# Patient Record
Sex: Male | Born: 1960 | Race: Black or African American | Hispanic: No | Smoking: Former smoker
Health system: Southern US, Community
[De-identification: ages and names within clinical notes are randomized; demographics above are authoritative.]

## PROBLEM LIST (undated history)

## (undated) DIAGNOSIS — J309 Allergic rhinitis, unspecified: Secondary | ICD-10-CM

## (undated) DIAGNOSIS — J45909 Unspecified asthma, uncomplicated: Secondary | ICD-10-CM

## (undated) DIAGNOSIS — J189 Pneumonia, unspecified organism: Secondary | ICD-10-CM

## (undated) DIAGNOSIS — I10 Essential (primary) hypertension: Secondary | ICD-10-CM

## (undated) DIAGNOSIS — I5030 Unspecified diastolic (congestive) heart failure: Secondary | ICD-10-CM

## (undated) HISTORY — PX: OTHER SURGICAL HISTORY: SHX169

---

## 2001-07-24 ENCOUNTER — Emergency Department (HOSPITAL_COMMUNITY): Admission: EM | Admit: 2001-07-24 | Discharge: 2001-07-24 | Payer: Self-pay | Admitting: *Deleted

## 2001-07-24 ENCOUNTER — Encounter: Payer: Self-pay | Admitting: *Deleted

## 2001-08-02 ENCOUNTER — Emergency Department (HOSPITAL_COMMUNITY): Admission: EM | Admit: 2001-08-02 | Discharge: 2001-08-02 | Payer: Self-pay | Admitting: Emergency Medicine

## 2001-11-10 ENCOUNTER — Emergency Department (HOSPITAL_COMMUNITY): Admission: EM | Admit: 2001-11-10 | Discharge: 2001-11-10 | Payer: Self-pay | Admitting: Emergency Medicine

## 2001-12-11 ENCOUNTER — Emergency Department (HOSPITAL_COMMUNITY): Admission: EM | Admit: 2001-12-11 | Discharge: 2001-12-11 | Payer: Self-pay | Admitting: Emergency Medicine

## 2001-12-11 ENCOUNTER — Encounter: Payer: Self-pay | Admitting: Emergency Medicine

## 2002-01-14 ENCOUNTER — Emergency Department (HOSPITAL_COMMUNITY): Admission: EM | Admit: 2002-01-14 | Discharge: 2002-01-14 | Payer: Self-pay

## 2016-12-20 ENCOUNTER — Emergency Department (HOSPITAL_COMMUNITY): Payer: Self-pay

## 2016-12-20 ENCOUNTER — Inpatient Hospital Stay (HOSPITAL_COMMUNITY): Payer: Self-pay

## 2016-12-20 ENCOUNTER — Encounter (HOSPITAL_COMMUNITY): Payer: Self-pay | Admitting: *Deleted

## 2016-12-20 ENCOUNTER — Inpatient Hospital Stay (HOSPITAL_COMMUNITY)
Admission: EM | Admit: 2016-12-20 | Discharge: 2016-12-24 | DRG: 291 | Disposition: A | Discharge status: Home / Self Care | Payer: Self-pay | Attending: Internal Medicine | Admitting: Internal Medicine

## 2016-12-20 DIAGNOSIS — Z833 Family history of diabetes mellitus: Secondary | ICD-10-CM

## 2016-12-20 DIAGNOSIS — R0602 Shortness of breath: Secondary | ICD-10-CM

## 2016-12-20 DIAGNOSIS — J849 Interstitial pulmonary disease, unspecified: Secondary | ICD-10-CM | POA: Diagnosis present

## 2016-12-20 DIAGNOSIS — J9600 Acute respiratory failure, unspecified whether with hypoxia or hypercapnia: Secondary | ICD-10-CM | POA: Diagnosis present

## 2016-12-20 DIAGNOSIS — E669 Obesity, unspecified: Secondary | ICD-10-CM | POA: Diagnosis present

## 2016-12-20 DIAGNOSIS — I5031 Acute diastolic (congestive) heart failure: Secondary | ICD-10-CM | POA: Diagnosis present

## 2016-12-20 DIAGNOSIS — I110 Hypertensive heart disease with heart failure: Principal | ICD-10-CM | POA: Diagnosis present

## 2016-12-20 DIAGNOSIS — J441 Chronic obstructive pulmonary disease with (acute) exacerbation: Secondary | ICD-10-CM | POA: Diagnosis present

## 2016-12-20 DIAGNOSIS — J45901 Unspecified asthma with (acute) exacerbation: Secondary | ICD-10-CM

## 2016-12-20 DIAGNOSIS — Z8249 Family history of ischemic heart disease and other diseases of the circulatory system: Secondary | ICD-10-CM

## 2016-12-20 DIAGNOSIS — R778 Other specified abnormalities of plasma proteins: Secondary | ICD-10-CM

## 2016-12-20 DIAGNOSIS — Z8349 Family history of other endocrine, nutritional and metabolic diseases: Secondary | ICD-10-CM

## 2016-12-20 DIAGNOSIS — R0989 Other specified symptoms and signs involving the circulatory and respiratory systems: Secondary | ICD-10-CM | POA: Diagnosis present

## 2016-12-20 DIAGNOSIS — Z79899 Other long term (current) drug therapy: Secondary | ICD-10-CM

## 2016-12-20 DIAGNOSIS — I509 Heart failure, unspecified: Secondary | ICD-10-CM

## 2016-12-20 DIAGNOSIS — Z87891 Personal history of nicotine dependence: Secondary | ICD-10-CM

## 2016-12-20 DIAGNOSIS — Z888 Allergy status to other drugs, medicaments and biological substances status: Secondary | ICD-10-CM

## 2016-12-20 DIAGNOSIS — B370 Candidal stomatitis: Secondary | ICD-10-CM | POA: Diagnosis present

## 2016-12-20 DIAGNOSIS — I248 Other forms of acute ischemic heart disease: Secondary | ICD-10-CM | POA: Diagnosis present

## 2016-12-20 DIAGNOSIS — I2489 Other forms of acute ischemic heart disease: Secondary | ICD-10-CM

## 2016-12-20 DIAGNOSIS — J449 Chronic obstructive pulmonary disease, unspecified: Secondary | ICD-10-CM

## 2016-12-20 DIAGNOSIS — I10 Essential (primary) hypertension: Secondary | ICD-10-CM

## 2016-12-20 DIAGNOSIS — R7989 Other specified abnormal findings of blood chemistry: Secondary | ICD-10-CM | POA: Diagnosis present

## 2016-12-20 DIAGNOSIS — R748 Abnormal levels of other serum enzymes: Secondary | ICD-10-CM

## 2016-12-20 DIAGNOSIS — J9621 Acute and chronic respiratory failure with hypoxia: Secondary | ICD-10-CM | POA: Diagnosis present

## 2016-12-20 DIAGNOSIS — Z801 Family history of malignant neoplasm of trachea, bronchus and lung: Secondary | ICD-10-CM

## 2016-12-20 DIAGNOSIS — R0609 Other forms of dyspnea: Secondary | ICD-10-CM

## 2016-12-20 DIAGNOSIS — J4551 Severe persistent asthma with (acute) exacerbation: Secondary | ICD-10-CM

## 2016-12-20 DIAGNOSIS — Z800 Family history of malignant neoplasm of digestive organs: Secondary | ICD-10-CM

## 2016-12-20 DIAGNOSIS — Z8701 Personal history of pneumonia (recurrent): Secondary | ICD-10-CM

## 2016-12-20 DIAGNOSIS — I5030 Unspecified diastolic (congestive) heart failure: Secondary | ICD-10-CM

## 2016-12-20 DIAGNOSIS — Z825 Family history of asthma and other chronic lower respiratory diseases: Secondary | ICD-10-CM

## 2016-12-20 DIAGNOSIS — Z9981 Dependence on supplemental oxygen: Secondary | ICD-10-CM

## 2016-12-20 DIAGNOSIS — Z6831 Body mass index (BMI) 31.0-31.9, adult: Secondary | ICD-10-CM

## 2016-12-20 HISTORY — DX: Allergic rhinitis, unspecified: J30.9

## 2016-12-20 HISTORY — DX: Unspecified asthma, uncomplicated: J45.909

## 2016-12-20 HISTORY — DX: Essential (primary) hypertension: I10

## 2016-12-20 HISTORY — DX: Unspecified diastolic (congestive) heart failure: I50.30

## 2016-12-20 HISTORY — DX: Pneumonia, unspecified organism: J18.9

## 2016-12-20 LAB — BASIC METABOLIC PANEL
ANION GAP: 7 (ref 5–15)
BUN: 18 mg/dL (ref 6–20)
CHLORIDE: 95 mmol/L — AB (ref 101–111)
CO2: 32 mmol/L (ref 22–32)
Calcium: 8.5 mg/dL — ABNORMAL LOW (ref 8.9–10.3)
Creatinine, Ser: 1.11 mg/dL (ref 0.61–1.24)
GFR calc Af Amer: >60 mL/min (ref >60)
GLUCOSE: 122 mg/dL — AB (ref 65–99)
POTASSIUM: 3.7 mmol/L (ref 3.5–5.1)
Sodium: 134 mmol/L — ABNORMAL LOW (ref 135–145)

## 2016-12-20 LAB — CBC WITH DIFFERENTIAL/PLATELET
BASOS ABS: 0 10*3/uL (ref 0.0–0.1)
Basophils Relative: 0 %
EOS PCT: 1 %
Eosinophils Absolute: 0.1 10*3/uL (ref 0.0–0.7)
HEMATOCRIT: 52.1 % — AB (ref 39.0–52.0)
HEMOGLOBIN: 16.8 g/dL (ref 13.0–17.0)
LYMPHS ABS: 1.7 10*3/uL (ref 0.7–4.0)
LYMPHS PCT: 12 %
MCH: 31.7 pg (ref 26.0–34.0)
MCHC: 32.2 g/dL (ref 30.0–36.0)
MCV: 98.3 fL (ref 78.0–100.0)
Monocytes Absolute: 2 10*3/uL — ABNORMAL HIGH (ref 0.1–1.0)
Monocytes Relative: 14 %
NEUTROS ABS: 10.6 10*3/uL — AB (ref 1.7–7.7)
Neutrophils Relative %: 73 %
PLATELETS: 194 10*3/uL (ref 150–400)
RBC: 5.3 MIL/uL (ref 4.22–5.81)
RDW: 13.6 % (ref 11.5–15.5)
WBC: 14.4 10*3/uL — AB (ref 4.0–10.5)

## 2016-12-20 LAB — BRAIN NATRIURETIC PEPTIDE: B Natriuretic Peptide: 361 pg/mL — ABNORMAL HIGH (ref 0.0–100.0)

## 2016-12-20 LAB — TROPONIN I
Troponin I: 0.17 ng/mL (ref <0.03)
Troponin I: 0.12 ng/mL (ref <0.03)
Troponin I: 0.12 ng/mL (ref <0.03)
Troponin I: 0.11 ng/mL (ref <0.03)

## 2016-12-20 MED ORDER — IPRATROPIUM-ALBUTEROL 0.5-2.5 (3) MG/3ML IN SOLN
3.0000 mL | Freq: Once | RESPIRATORY_TRACT | Status: AC
  Administered 2016-12-20: 3 mL via RESPIRATORY_TRACT
  Filled 2016-12-20: qty 3

## 2016-12-20 MED ORDER — HEPARIN SODIUM (PORCINE) 5000 UNIT/ML IJ SOLN
5000.0000 [IU] | Freq: Three times a day (TID) | INTRAMUSCULAR | Status: DC
  Administered 2016-12-22 – 2016-12-23 (×2): 5000 [IU] via SUBCUTANEOUS
  Filled 2016-12-20 (×7): qty 1

## 2016-12-20 MED ORDER — IPRATROPIUM-ALBUTEROL 0.5-2.5 (3) MG/3ML IN SOLN
3.0000 mL | Freq: Three times a day (TID) | RESPIRATORY_TRACT | Status: DC
  Administered 2016-12-21 (×3): 3 mL via RESPIRATORY_TRACT
  Filled 2016-12-20 (×3): qty 3

## 2016-12-20 MED ORDER — ALBUTEROL SULFATE (2.5 MG/3ML) 0.083% IN NEBU
5.0000 mg | INHALATION_SOLUTION | Freq: Once | RESPIRATORY_TRACT | Status: AC
  Administered 2016-12-20: 5 mg via RESPIRATORY_TRACT
  Filled 2016-12-20: qty 6

## 2016-12-20 MED ORDER — ALBUTEROL SULFATE (2.5 MG/3ML) 0.083% IN NEBU: 5.0000 mg | INHALATION_SOLUTION | Freq: Once | RESPIRATORY_TRACT | Status: DC

## 2016-12-20 MED ORDER — IPRATROPIUM BROMIDE 0.02 % IN SOLN
0.5000 mg | Freq: Once | RESPIRATORY_TRACT | Status: AC
  Administered 2016-12-20: 0.5 mg via RESPIRATORY_TRACT
  Filled 2016-12-20: qty 2.5

## 2016-12-20 MED ORDER — ONDANSETRON HCL 4 MG/2ML IJ SOLN
4.0000 mg | Freq: Four times a day (QID) | INTRAMUSCULAR | Status: DC | PRN
Start: 2016-12-20 — End: 2016-12-24

## 2016-12-20 MED ORDER — NITROGLYCERIN 0.4 MG SL SUBL: 0.4000 mg | SUBLINGUAL_TABLET | SUBLINGUAL | Status: DC | PRN

## 2016-12-20 MED ORDER — IPRATROPIUM-ALBUTEROL 0.5-2.5 (3) MG/3ML IN SOLN
3.0000 mL | RESPIRATORY_TRACT | Status: DC | PRN
  Administered 2016-12-21 (×3): 3 mL via RESPIRATORY_TRACT
  Filled 2016-12-20 (×3): qty 3

## 2016-12-20 MED ORDER — ACETAMINOPHEN 325 MG PO TABS: 650.0000 mg | ORAL_TABLET | ORAL | Status: DC | PRN

## 2016-12-20 MED ORDER — ASPIRIN EC 325 MG PO TBEC
325.0000 mg | DELAYED_RELEASE_TABLET | Freq: Every day | ORAL | Status: DC
  Administered 2016-12-20 – 2016-12-24 (×5): 325 mg via ORAL
  Filled 2016-12-20 (×5): qty 1

## 2016-12-20 MED ORDER — IPRATROPIUM-ALBUTEROL 0.5-2.5 (3) MG/3ML IN SOLN
RESPIRATORY_TRACT | Status: AC
  Administered 2016-12-20: 3 mL
  Filled 2016-12-20: qty 3

## 2016-12-20 MED ORDER — METHYLPREDNISOLONE SODIUM SUCC 125 MG IJ SOLR
125.0000 mg | Freq: Two times a day (BID) | INTRAMUSCULAR | Status: DC
  Administered 2016-12-20 – 2016-12-21 (×3): 125 mg via INTRAVENOUS
  Filled 2016-12-20 (×3): qty 2

## 2016-12-20 MED ORDER — METHYLPREDNISOLONE SODIUM SUCC 125 MG IJ SOLR
125.0000 mg | Freq: Once | INTRAMUSCULAR | Status: AC
  Administered 2016-12-20: 125 mg via INTRAVENOUS
  Filled 2016-12-20 (×2): qty 2

## 2016-12-20 MED ORDER — IPRATROPIUM-ALBUTEROL 0.5-2.5 (3) MG/3ML IN SOLN
3.0000 mL | Freq: Once | RESPIRATORY_TRACT | Status: AC
Start: 2016-12-20 — End: 2016-12-20
  Administered 2016-12-20: 3 mL via RESPIRATORY_TRACT
  Filled 2016-12-20: qty 3

## 2016-12-20 NOTE — ED Notes (Signed)
Meal given to pt per Dr. Adriana Simas.

## 2016-12-20 NOTE — H&P (Signed)
History and Physical    Leslie Green ZOX:[id number] DOB: 21-Aug-1960 DOA: 12/20/2016  Referring MD/NP/PA: Adriana Simas  PCP: System, Provider Not In  Outpatient Specialists: none   Patient coming from: Home   Chief Complaint: Acute resp failure w/ hypoxia, CHF, COPD, demand ischemia   HPI: Leslie Green is a 56 y.o. male with medical history significant of asthma/COPD, HTN, obesity presenting w/ acute resp failure w/ hypoxia, CHF, COPD, demand ischemia. Patient reports progressive shortness of breath over the past 2 or 3 months. States that he was formally diagnosed with asthma/COPD in the outpatient setting. Has been using inhalers with minimal improvement in symptoms. Does report having dyspnea on exertion. Intermittent orthopnea as well as lower Chumney swelling. Was placed on hydrochlorothiazide in the outpatient setting for hypertension. Has help with fluid but has not helped with shortness of breath. No chest pain. Does admit to eating high salt diet. Denies any heavy NSAID use. No prior history of cardiac disease. Prior 1 pack per day smoking history up until approximately 2-3 weeks ago.  ED Course: Presented to ER afebrile, hemodynamically stable. WBC 14. Trop 0.17, BNP 316. Chest x-ray with cardiomegaly and mild pulmonary vascular congestion also underlying chronic interstitial lung disease.  Review of Systems: As per HPI otherwise 10 point review of systems negative.    Past Medical History:  Diagnosis Date  . Allergic rhinitis   . Asthma   . Essential hypertension   . Recurrent pneumonia     Past Surgical History:  Procedure Laterality Date  . None       reports that he has quit smoking. He has never used smokeless tobacco. He reports that he drinks alcohol. He reports that he does not use drugs.  Allergies  Allergen Reactions  . Theophyllines Other (See Comments)    Headache     Family History  Problem Relation Age of Onset  . Hypertension Mother   . Asthma Mother   . Colon  cancer Father   . Lung cancer Father   . Diabetes Mellitus II Father   . Hypertension Father   . Hyperlipidemia Father   . CAD Maternal Grandmother   . CAD Maternal Uncle      Prior to Admission medications   Medication Sig Start Date End Date Taking? Authorizing Provider  albuterol (PROVENTIL HFA;VENTOLIN HFA) 108 (90 Base) MCG/ACT inhaler Inhale 1 puff into the lungs every 4 (four) hours as needed.   Yes [provider]  albuterol (PROVENTIL) (2.5 MG/3ML) 0.083% nebulizer solution Inhale 2.5 mg into the lungs 3 (three) times daily.   Yes [provider]  fluticasone furoate-vilanterol (BREO ELLIPTA) 200-25 MCG/INH AEPB Inhale 1 puff into the lungs daily.   Yes [provider]  hydrochlorothiazide (HYDRODIURIL) 25 MG tablet Take 1 tablet by mouth daily.   Yes [provider]  ipratropium (ATROVENT) 0.02 % nebulizer solution Inhale 0.5 mg into the lungs 4 (four) times daily.   Yes [provider]  lovastatin (MEVACOR) 20 MG tablet Take 1 tablet by mouth every evening.   Yes [provider]  mometasone (NASONEX) 50 MCG/ACT nasal spray Place 2 sprays into the nose daily.   Yes [provider]  umeclidinium bromide (INCRUSE ELLIPTA) 62.5 MCG/INH AEPB Inhale 1 puff into the lungs daily.   Yes [provider]    Physical Exam: Vitals:   12/20/16 1500 12/20/16 1505 12/20/16 1530 12/20/16 1545  BP: (!) 128/55  (!) 123/57   Pulse:  95  93  Resp:  20 17   Temp:      TempSrc:      SpO2:  94%  94%  Weight:      Height:          Constitutional: NAD, calm, minimal to mild distress, morbidly obese  Vitals:   12/20/16 1500 12/20/16 1505 12/20/16 1530 12/20/16 1545  BP: (!) 128/55  (!) 123/57   Pulse:  95  93  Resp:  20 17   Temp:      TempSrc:      SpO2:  94%  94%  Weight:      Height:       Eyes: PERRL, lids and conjunctivae normal ENMT: Mucous membranes are moist. Posterior pharynx clear of any exudate or  lesions.Normal dentition.  Neck: normal, supple, no masses, no thyromegaly Respiratory: clear to auscultation bilaterally, + diffuse wheezing. Normal respiratory effort. No accessory muscle use.  Cardiovascular: Regular rate and rhythm, no murmurs / rubs / gallops. No extremity edema. 2+ pedal pulses. No carotid bruits.  Abdomen: obese abdomen, no tenderness, no masses palpated. No hepatosplenomegaly. Bowel sounds positive.  Musculoskeletal: no clubbing / cyanosis. No joint deformity upper and lower extremities. Good ROM, no contractures. Normal muscle tone.  Skin: no rashes, lesions, ulcers. No induration Neurologic: CN 2-12 grossly intact. Sensation intact, DTR normal. Strength 5/5 in all 4.  Psychiatric: Normal judgment and insight. Alert and oriented x 3. Normal mood.    Labs on Admission: I have personally reviewed following labs and imaging studies  CBC:  Recent Labs Lab 12/20/16 0945  WBC 14.4*  NEUTROABS 10.6*  HGB 16.8  HCT 52.1*  MCV 98.3  PLT 194   Basic Metabolic Panel:  Recent Labs Lab 12/20/16 0945  NA 134*  K 3.7  CL 95*  CO2 32  GLUCOSE 122*  BUN 18  CREATININE 1.11  CALCIUM 8.5*   GFR: Estimated Creatinine Clearance: 91.6 mL/min (by C-G formula based on SCr of 1.11 mg/dL). Liver Function Tests: No results for input(s): AST, ALT, ALKPHOS, BILITOT, PROT, ALBUMIN in the last 168 hours. No results for input(s): LIPASE, AMYLASE in the last 168 hours. No results for input(s): AMMONIA in the last 168 hours. Coagulation Profile: No results for input(s): INR, PROTIME in the last 168 hours. Cardiac Enzymes:  Recent Labs Lab 12/20/16 0945  TROPONINI 0.17*   BNP (last 3 results) No results for input(s): PROBNP in the last 8760 hours. HbA1C: No results for input(s): HGBA1C in the last 72 hours. CBG: No results for input(s): GLUCAP in the last 168 hours. Lipid Profile: No results for input(s): CHOL, HDL, LDLCALC, TRIG, CHOLHDL, LDLDIRECT in the last 72  hours. Thyroid Function Tests: No results for input(s): TSH, T4TOTAL, FREET4, T3FREE, THYROIDAB in the last 72 hours. Anemia Panel: No results for input(s): VITAMINB12, FOLATE, FERRITIN, TIBC, IRON, RETICCTPCT in the last 72 hours. Urine analysis: No results found for: COLORURINE, APPEARANCEUR, LABSPEC, PHURINE, GLUCOSEU, HGBUR, BILIRUBINUR, KETONESUR, PROTEINUR, UROBILINOGEN, NITRITE, LEUKOCYTESUR Sepsis Labs: @LABRCNTIP (procalcitonin:4,lacticidven:4) )No results found for this or any previous visit (from the past 240 hour(s)).   Radiological Exams on Admission: Dg Chest 2 View  Result Date: 12/20/2016 CLINICAL DATA:  Cough and congestion. EXAM: CHEST  2 VIEW COMPARISON:  Chest x-ray 12/11/2001 . FINDINGS: Mediastinum hilar structures are normal. Cardiomegaly with mild pulmonary vascular prominence. Diffuse bilateral pulmonary interstitial prominence noted. These changes may be related to interstitial edema and/or pneumonitis. Underlying chronic interstitial lung disease may be present. Chronic interstitial lung disease was described on  prior chest x-ray report of 12/11/2001. No pneumothorax. IMPRESSION: 1. Cardiomegaly with mild pulmonary venous congestion. 2. Diffuse bilateral pulmonary interstitial prominence noted. These changes may be related to interstitial edema and/or pneumonitis. Underlying chronic interstitial lung disease may be present. Chronic interstitial lung disease described on prior chest x-ray report of 12/11/2001. Follow-up chest x-ray can be obtained for continued evaluation . Electronically Signed   By: Maisie Fus  Register   On: 12/20/2016 10:46    EKG: Independently reviewed. NSR, ? LVH  Assessment/Plan Active Problems:   Acute respiratory failure (HCC)   CHF (congestive heart failure) (HCC)   COPD (chronic obstructive pulmonary disease) (HCC)   Demand ischemia (HCC)     1-Acute resp failure w/ hypoxia -Suspect likely multifactorial from CHF and COPD -Cardiology  consulted-appreciate input -IV diuretics and 2-D echo -IV Solu-Medrol and duo nebs -CT chest pending to better assess lung pathology   2-Elevated trop  -trop 0.17 on admission  -suspect demand ischemia in setting of above  -cards consulted-appreciate input  -start ASA  -trend trop  -IV diuresis  -2D ECHO   3-HTN -titrate BP regimen    DVT prophylaxis: sub q heparin   Code Status: Full Code   Family Communication: No family at bedside   Disposition Plan: Pending further evaluation  Consults called: Cards   Admission status: Inpt    Floydene Flock MD [facility] Pager [phone]  If 7PM-7AM, please contact night-coverage [email or web address] Password [facility]  12/20/2016, 4:31 PM

## 2016-12-20 NOTE — ED Notes (Addendum)
Pt taken to CT.

## 2016-12-20 NOTE — ED Notes (Signed)
Pt requesting neb tx, RT notified.

## 2016-12-20 NOTE — Consult Note (Signed)
Cardiology Consultation:   Patient ID: Leslie Green; [id number]; 1960-06-28   Admit date: 12/20/2016 Date of Consult: 12/20/2016   Primary Care Provider: Kandis Fantasia FNP, [place], [place]  Consulting Cardiologist: Jonelle Sidle, M.D., F.A.C.C.  Patient Profile:   Leslie Green is a 55 y.o. male with a history of asthma, allergic rhinitis, and recurring pneumonia, being evaluated at the request of Dr. Adriana Simas secondary to abnormal troponin I level.  History of Present Illness:   Leslie Green states he woke up and "couldn't breathe" this morning. Was wheezing and having another possible asthma attack. He came to the emergency room for treatment and during workup was found to have a mildly elevated troponin I of 0.17 in the absence of chest pain. He has no history of CAD and says he only has tightness with an asthma attack. He has chronic dyspnea on exertion, states that it has been worse since his last episode of pneumonia. Recently had trouble with some leg swelling but improved with diet. Currently denies chest pain and feels much better after breathing treatment and steroids. He used to be active and walk daily but stopped after pneumonia in January. He has history of hypertension treated with HCTZ and HLD. He has smoked for over 20 pack years but says he just quit when he was told he had COPD. Distant family history of CAD with some uncles and a grandmother with an MI.  Patient went to [facility] last week with an asthma attack and was given steroids and inhalers.  Past Medical History:  Diagnosis Date  . Allergic rhinitis   . Asthma   . Essential hypertension   . Recurrent pneumonia     Past Surgical History:  Procedure Laterality Date  . None       Home Medications:  Prior to Admission medications   Medication Sig Start Date End Date Taking? Authorizing Provider  albuterol (PROVENTIL HFA;VENTOLIN HFA) 108 (90 Base) MCG/ACT inhaler Inhale 1 puff into the lungs every 4 (four)  hours as needed.   Yes [provider]  albuterol (PROVENTIL) (2.5 MG/3ML) 0.083% nebulizer solution Inhale 2.5 mg into the lungs 3 (three) times daily.   Yes [provider]  fluticasone furoate-vilanterol (BREO ELLIPTA) 200-25 MCG/INH AEPB Inhale 1 puff into the lungs daily.   Yes [provider]  hydrochlorothiazide (HYDRODIURIL) 25 MG tablet Take 1 tablet by mouth daily.   Yes [provider]  ipratropium (ATROVENT) 0.02 % nebulizer solution Inhale 0.5 mg into the lungs 4 (four) times daily.   Yes [provider]  lovastatin (MEVACOR) 20 MG tablet Take 1 tablet by mouth every evening.   Yes [provider]  mometasone (NASONEX) 50 MCG/ACT nasal spray Place 2 sprays into the nose daily.   Yes [provider]  umeclidinium bromide (INCRUSE ELLIPTA) 62.5 MCG/INH AEPB Inhale 1 puff into the lungs daily.   Yes [provider]    Allergies:    Allergies  Allergen Reactions  . Theophyllines Other (See Comments)    Headache     Social History:   Social History   Social History  . Marital status: Divorced    Spouse name: N/A  . Number of children: N/A  . Years of education: N/A   Occupational History  . Not on file.   Social History Main Topics  . Smoking status: Former Games developer  . Smokeless tobacco: Never Used  . Alcohol use Yes     Comment: occasionally   .  Drug use: No  . Sexual activity: Not on file   Other Topics Concern  . Not on file   Social History Narrative  . No narrative on file    Family History:    Family History  Problem Relation Age of Onset  . Hypertension Mother   . Asthma Mother   . Colon cancer Father   . Lung cancer Father   . Diabetes Mellitus II Father   . Hypertension Father   . Hyperlipidemia Father   . CAD Maternal Grandmother   . CAD Maternal Uncle      ROS:  Please see the history of present illness.  Review of Systems  Constitution: Positive for weight gain.    HENT: Negative.   Cardiovascular: Negative.   Respiratory: Negative.   Endocrine: Negative.   Hematologic/Lymphatic: Negative.   Musculoskeletal: Negative.   Gastrointestinal: Negative.   Genitourinary: Negative.   Neurological: Negative.      All other ROS reviewed and negative.     Physical Exam/Data:   Vitals:   12/20/16 1500 12/20/16 1505 12/20/16 1530 12/20/16 1545  BP: (!) 128/55  (!) 123/57   Pulse:  95  93  Resp:  20 17   Temp:      TempSrc:      SpO2:  94%  94%  Weight:      Height:       No intake or output data in the 24 hours ending 12/20/16 1604 Filed Weights   12/20/16 0928  Weight: 220 lb (99.8 kg)   Body mass index is 30.68 kg/m.   General:  Overweight male, no acute distress  HEENT: normal Lymph: no adenopathy Neck: no JVD Endocrine:  No thryomegaly Vascular: No carotid bruits; FA pulses 2+ bilaterally without bruits  Cardiac:  normal S1, S2; RRR; no murmur distant heart sounds with wheezing Lungs:  Diffuse inspiratory and expiratory wheezing throughout lung fields  Abd: soft, nontender, no hepatomegaly  Ext: no edema Musculoskeletal:  No deformities, BUE and BLE strength normal and equal Skin: warm and dry  Neuro:  CNs 2-12 intact, no focal abnormalities noted Psych:  Normal affect   EKG:  The EKG was personally reviewed and demonstrates: Normal sinus rhythm with RVH no old EKG to compare Telemetry:  Telemetry was personally reviewed and demonstrates:  Normal sinus rhythm at 90 bpm  Laboratory Data:  Chemistry  Recent Labs Lab 12/20/16 0945  NA 134*  K 3.7  CL 95*  CO2 32  GLUCOSE 122*  BUN 18  CREATININE 1.11  CALCIUM 8.5*  GFRNONAA >60  GFRAA >60  ANIONGAP 7    No results for input(s): PROT, ALBUMIN, AST, ALT, ALKPHOS, BILITOT in the last 168 hours. Hematology  Recent Labs Lab 12/20/16 0945  WBC 14.4*  RBC 5.30  HGB 16.8  HCT 52.1*  MCV 98.3  MCH 31.7  MCHC 32.2  RDW 13.6  PLT 194   Cardiac Enzymes  Recent  Labs Lab 12/20/16 0945  TROPONINI 0.17*   No results for input(s): TROPIPOC in the last 168 hours.  BNP  Recent Labs Lab 12/20/16 0945  BNP 361.0*    DDimer No results for input(s): DDIMER in the last 168 hours.  Radiology/Studies:  Dg Chest 2 View  Result Date: 12/20/2016 CLINICAL DATA:  Cough and congestion. EXAM: CHEST  2 VIEW COMPARISON:  Chest x-ray 12/11/2001 . FINDINGS: Mediastinum hilar structures are normal. Cardiomegaly with mild pulmonary vascular prominence. Diffuse bilateral pulmonary interstitial prominence noted. These changes may be  related to interstitial edema and/or pneumonitis. Underlying chronic interstitial lung disease may be present. Chronic interstitial lung disease was described on prior chest x-ray report of 12/11/2001. No pneumothorax. IMPRESSION: 1. Cardiomegaly with mild pulmonary venous congestion. 2. Diffuse bilateral pulmonary interstitial prominence noted. These changes may be related to interstitial edema and/or pneumonitis. Underlying chronic interstitial lung disease may be present. Chronic interstitial lung disease described on prior chest x-ray report of 12/11/2001. Follow-up chest x-ray can be obtained for continued evaluation . Electronically Signed   By: Maisie Fus  Register   On: 12/20/2016 10:46    Assessment and Plan:   1. Elevated troponin question secondary to demand ischemia with respiratory distress acute asthma attack. EKG without acute change .Does has risk factors for CAD. We'll cycle troponins to rule out MI.  2-D echo. Patient is asking for limited testing because he has no insurance. 2. Hypertension controlled with HCTZ 3. HLD 4. ? mild CHF with BNP of 361 and mild pulmonary venous congestion on chest x-ray. Exam unremarkable. Await echo results.   Elson Clan, PA-C 12/20/2016 4:04 PM    Attending note:  Patient seen and examined. Discussed the case with Ms. Sharmon Leyden. Leslie Green presents with shortness of breath and  wheezing, intermittent cough, reports recurring symptoms over the last several months but worse since an episode of pneumonia earlier this year. He has history of tobacco abuse, has been diagnosed with asthma and COPD based on outside records, had pulmonary consultation through the [facility] clinic back in May. He does not report any chest pain with exertion but reports NYHA class III dyspnea on exertion. He has had some intermittent leg edema as well. Additional history includes hypertension.  Patient evaluated in the ER, treated with nebulizer therapy and also steroids, states that he is feeling better. Systolic blood pressure in the 120s with sinus rhythm present on telemetry. Lungs exhibit rhonchorous breath sounds with prolonged expiratory phase and wheezing, decreased breath sounds at the bases. Cardiac exam reveals distant RRR without obvious gallop although PMI is indistinct. Lab work shows creatinine 1.1, potassium 3.7, BNP 361, troponin I 0.17, WBC 14.4, hemoglobin 16.8. ECG shows sinus rhythm with rightward axis. Chest x-ray shows cardiomegaly with interstitial opacities that are diffuse, edema or interstitial lung disease to be considered.  Mildly elevated troponin I in the absence of chest pain but with respiratory distress and reported history of asthma/COPD with worsening dyspnea on exertion, fatigue, and cough as noted above. Could be secondary to myocardial strain/demand ischemia, however need further serial markers to better document trend for ACS. Recommend admission to the hospitalist service for further management. Would obtain an echocardiogram as well as to exclude cardiomyopathy in light of his dyspnea on exertion. Of note, patient has no health insurance, would recommend case manager involvement for potential assistance programs.  Jonelle Sidle, M.D., F.A.C.C.

## 2016-12-20 NOTE — ED Notes (Signed)
Pt states that he may want to leave AMA. Pt states that it was complicated, but he didn't know if he wanted to be admitted and be on a strict diet. Dr. Adriana Simas notified and will go back and speak to the pt for a second time regarding admission.

## 2016-12-20 NOTE — ED Provider Notes (Signed)
[facility]Y DEPT Provider Note   CSN: [id number] Arrival date & time: 12/20/16  4098     History   Chief Complaint Chief Complaint  Patient presents with  . Shortness of Breath    HPI Leslie Green is a 56 y.o. male.  Patient has long-standing asthma since his childhood. He lives in [place].  Coughing and wheezing has persisted for several weeks.   He was diagnosed with pneumonia earlier this year and has put on 40 pounds secondary to persistent prednisone usage.  He is on 3 L of nasal oxygen at night.  No known history of cardiac disease.  No crushing substernal chest pain, diaphoresis, nausea.      Past Medical History:  Diagnosis Date  . Allergic rhinitis   . Asthma   . Essential hypertension   . Recurrent pneumonia     There are no active problems to display for this patient.   Past Surgical History:  Procedure Laterality Date  . None         Home Medications    Prior to Admission medications   Medication Sig Start Date End Date Taking? Authorizing Provider  albuterol (PROVENTIL HFA;VENTOLIN HFA) 108 (90 Base) MCG/ACT inhaler Inhale 1 puff into the lungs every 4 (four) hours as needed.   Yes [provider]  albuterol (PROVENTIL) (2.5 MG/3ML) 0.083% nebulizer solution Inhale 2.5 mg into the lungs 3 (three) times daily.   Yes [provider]  fluticasone furoate-vilanterol (BREO ELLIPTA) 200-25 MCG/INH AEPB Inhale 1 puff into the lungs daily.   Yes [provider]  hydrochlorothiazide (HYDRODIURIL) 25 MG tablet Take 1 tablet by mouth daily.   Yes [provider]  ipratropium (ATROVENT) 0.02 % nebulizer solution Inhale 0.5 mg into the lungs 4 (four) times daily.   Yes [provider]  lovastatin (MEVACOR) 20 MG tablet Take 1 tablet by mouth every evening.   Yes [provider]  mometasone (NASONEX) 50 MCG/ACT nasal spray Place 2 sprays into the nose daily.   Yes [provider]  umeclidinium  bromide (INCRUSE ELLIPTA) 62.5 MCG/INH AEPB Inhale 1 puff into the lungs daily.   Yes [provider]    Family History Family History  Problem Relation Age of Onset  . Hypertension Mother   . Asthma Mother   . Colon cancer Father   . Lung cancer Father   . Diabetes Mellitus II Father   . Hypertension Father   . Hyperlipidemia Father   . CAD Maternal Grandmother   . CAD Maternal Uncle     Social History Social History  Substance Use Topics  . Smoking status: Former Games developer  . Smokeless tobacco: Never Used  . Alcohol use Yes     Comment: occasionally      Allergies   Theophyllines   Review of Systems Review of Systems  All other systems reviewed and are negative.    Physical Exam Updated Vital Signs BP (!) 123/57   Pulse 93   Temp 98.1 F (36.7 C) (Oral)   Resp 17   Ht 5\' 11"  (1.803 m)   Wt 99.8 kg (220 lb)   SpO2 94%   BMI 30.68 kg/m   Physical Exam  Constitutional: He is oriented to person, place, and time. He appears well-developed and well-nourished.  HENT:  Head: Normocephalic and atraumatic.  Eyes: Conjunctivae are normal.  Neck: Neck supple.  Cardiovascular: Normal rate and regular rhythm.   Pulmonary/Chest:  Dyspneic, tachypneic, wheezing  Abdominal: Soft. Bowel sounds are  normal.  Musculoskeletal: Normal range of motion.  Neurological: He is alert and oriented to person, place, and time.  Skin: Skin is warm and dry.  Psychiatric: He has a normal mood and affect. His behavior is normal.  Nursing note and vitals reviewed.    ED Treatments / Results  Labs (all labs ordered are listed, but only abnormal results are displayed) Labs Reviewed  CBC WITH DIFFERENTIAL/PLATELET - Abnormal; Notable for the following:       Result Value   WBC 14.4 (*)    HCT 52.1 (*)    Neutro Abs 10.6 (*)    Monocytes Absolute 2.0 (*)    All other components within normal limits  BASIC METABOLIC PANEL - Abnormal; Notable for the following:    Sodium  134 (*)    Chloride 95 (*)    Glucose, Bld 122 (*)    Calcium 8.5 (*)    All other components within normal limits  TROPONIN I - Abnormal; Notable for the following:    Troponin I 0.17 (*)    All other components within normal limits  BRAIN NATRIURETIC PEPTIDE - Abnormal; Notable for the following:    B Natriuretic Peptide 361.0 (*)    All other components within normal limits    EKG  EKG Interpretation  Date/Time:  Wednesday December 20 2016 09:33:09 EDT Ventricular Rate:  91 PR Interval:    QRS Duration: 100 QT Interval:  378 QTC Calculation: 466 R Axis:   126 Text Interpretation:  Sinus rhythm Consider right ventricular hypertrophy Confirmed by Donnetta Hutching ([id number]) on 12/20/2016 1:28:24 PM       Radiology Dg Chest 2 View  Result Date: 12/20/2016 CLINICAL DATA:  Cough and congestion. EXAM: CHEST  2 VIEW COMPARISON:  Chest x-ray 12/11/2001 . FINDINGS: Mediastinum hilar structures are normal. Cardiomegaly with mild pulmonary vascular prominence. Diffuse bilateral pulmonary interstitial prominence noted. These changes may be related to interstitial edema and/or pneumonitis. Underlying chronic interstitial lung disease may be present. Chronic interstitial lung disease was described on prior chest x-ray report of 12/11/2001. No pneumothorax. IMPRESSION: 1. Cardiomegaly with mild pulmonary venous congestion. 2. Diffuse bilateral pulmonary interstitial prominence noted. These changes may be related to interstitial edema and/or pneumonitis. Underlying chronic interstitial lung disease may be present. Chronic interstitial lung disease described on prior chest x-ray report of 12/11/2001. Follow-up chest x-ray can be obtained for continued evaluation . Electronically Signed   By: Maisie Fus  Register   On: 12/20/2016 10:46    Procedures Procedures (including critical care time)  Medications Ordered in ED Medications  ipratropium-albuterol (DUONEB) 0.5-2.5 (3) MG/3ML nebulizer solution 3 mL (3  mLs Nebulization Given 12/20/16 1018)  methylPREDNISolone sodium succinate (SOLU-MEDROL) 125 mg/2 mL injection 125 mg (125 mg Intravenous Given 12/20/16 1024)  ipratropium-albuterol (DUONEB) 0.5-2.5 (3) MG/3ML nebulizer solution (3 mLs  Given 12/20/16 1306)     Initial Impression / Assessment and Plan / ED Course  I have reviewed the triage vital signs and the nursing notes.  Pertinent labs & imaging results that were available during my care of the patient were reviewed by me and considered in my medical decision making (see chart for details).     Patient with a known history of asthma presents with persistent coughing and wheezing. He responded reasonably well to IV steroids and nebulizer treatments. Troponin was noted to be elevated. Discussed with cardiologist Dr. Diona Browner. He will consult on patient. Admit to general medicine.   CRITICAL CARE Performed by: Donnetta Hutching Total critical care  time: 30 minutes Critical care time was exclusive of separately billable procedures and treating other patients. Critical care was necessary to treat or prevent imminent or life-threatening deterioration. Critical care was time spent personally by me on the following activities: development of treatment plan with patient and/or surrogate as well as nursing, discussions with consultants, evaluation of patient's response to treatment, examination of patient, obtaining history from patient or surrogate, ordering and performing treatments and interventions, ordering and review of laboratory studies, ordering and review of radiographic studies, pulse oximetry and re-evaluation of patient's condition.  Final Clinical Impressions(s) / ED Diagnoses   Final diagnoses:  Severe persistent asthma with exacerbation  Elevated troponin    New Prescriptions New Prescriptions   No medications on file     Donnetta Hutching, MD 12/20/16 [phone]

## 2016-12-20 NOTE — ED Notes (Signed)
Attempted report x1. 

## 2016-12-20 NOTE — ED Notes (Signed)
CRITICAL VALUE ALERT  Critical Value: Troponin 0.17  Date & Time Notied:  1303 12/20/16  Provider Notified: cook  Orders Received/Actions taken: none at this time

## 2016-12-20 NOTE — ED Triage Notes (Signed)
Pt c/o dyspnea and chills x 1 week. Denies cough. Pt has been using nebulizers and inhalers with no relief this morning.

## 2016-12-20 NOTE — ED Notes (Signed)
Pt now agreeing to stay, heart healthy diet ordered for pt.

## 2016-12-20 NOTE — ED Notes (Signed)
Admitting MD at bedside.

## 2016-12-21 ENCOUNTER — Inpatient Hospital Stay (HOSPITAL_COMMUNITY): Payer: Self-pay

## 2016-12-21 DIAGNOSIS — I248 Other forms of acute ischemic heart disease: Secondary | ICD-10-CM

## 2016-12-21 DIAGNOSIS — B370 Candidal stomatitis: Secondary | ICD-10-CM | POA: Diagnosis present

## 2016-12-21 DIAGNOSIS — J438 Other emphysema: Secondary | ICD-10-CM

## 2016-12-21 DIAGNOSIS — J9601 Acute respiratory failure with hypoxia: Secondary | ICD-10-CM

## 2016-12-21 DIAGNOSIS — I10 Essential (primary) hypertension: Secondary | ICD-10-CM

## 2016-12-21 DIAGNOSIS — I509 Heart failure, unspecified: Secondary | ICD-10-CM

## 2016-12-21 LAB — ECHOCARDIOGRAM COMPLETE
AOPV: 0.8 m/s
AV Area VTI index: 0.92 cm2/m2
AV Area VTI: 2.03 cm2
AV Mean grad: 11 mmHg
AV Peak grad: 21 mmHg
AV peak Index: 0.88
AV vel: 2.11
AVAREAMEANV: 1.84 cm2
AVAREAMEANVIN: 0.8 cm2/m2
AVCELMEANRAT: 0.72
AVLVOTPG: 13 mmHg
AVPKVEL: 229 cm/s
CHL CUP DOP CALC LVOT VTI: 36.9 cm
CHL CUP MV DEC (S): 162
CHL CUP STROKE VOLUME: 65 mL
DOP CAL AO MEAN VELOCITY: 155 cm/s
E decel time: 162 msec
E/e' ratio: 10.31
FS: 51 % — AB (ref 28–44)
Height: 71 in
IV/PV OW: 0.83
LA vol A4C: 47.1 ml
LA vol index: 19.3 mL/m2
LA vol: 44.4 mL
LADIAMINDEX: 1.61 cm/m2
LASIZE: 37 mm
LDCA: 2.54 cm2
LEFT ATRIUM END SYS DIAM: 37 mm
LV E/e' medial: 10.31
LV E/e'average: 10.31
LV PW d: 12.9 mm — AB (ref 0.6–1.1)
LV SIMPSON'S DISK: 69
LV TDI E'MEDIAL: 11.7
LV dias vol index: 41 mL/m2
LV e' LATERAL: 12.8 cm/s
LV sys vol index: 13 mL/m2
LVDIAVOL: 95 mL (ref 62–150)
LVOT SV: 94 mL
LVOT diameter: 18 mm
LVOT peak VTI: 0.83 cm
LVOTPV: 183 cm/s
LVSYSVOL: 30 mL (ref 21–61)
MV Peak grad: 7 mmHg
MVPKEVEL: 132 m/s
RV LATERAL S' VELOCITY: 12.3 cm/s
TAPSE: 23.8 mm
TDI e' lateral: 12.8
VTI: 44.5 cm
Valve area index: 0.92
Valve area: 2.11 cm2
Weight: 3622.4 oz

## 2016-12-21 LAB — HIV ANTIBODY (ROUTINE TESTING W REFLEX): HIV SCREEN 4TH GENERATION: NONREACTIVE

## 2016-12-21 MED ORDER — GUAIFENESIN ER 600 MG PO TB12
1200.0000 mg | ORAL_TABLET | Freq: Two times a day (BID) | ORAL | Status: DC
  Administered 2016-12-21 – 2016-12-24 (×6): 1200 mg via ORAL
  Filled 2016-12-21 (×6): qty 2

## 2016-12-21 MED ORDER — FLUCONAZOLE 100 MG PO TABS
100.0000 mg | ORAL_TABLET | Freq: Every day | ORAL | Status: DC
  Administered 2016-12-21 – 2016-12-24 (×4): 100 mg via ORAL
  Filled 2016-12-21 (×4): qty 1

## 2016-12-21 MED ORDER — METHYLPREDNISOLONE SODIUM SUCC 125 MG IJ SOLR
60.0000 mg | Freq: Four times a day (QID) | INTRAMUSCULAR | Status: DC
  Administered 2016-12-21 – 2016-12-24 (×11): 60 mg via INTRAVENOUS
  Filled 2016-12-21 (×11): qty 2

## 2016-12-21 MED ORDER — NYSTATIN 100000 UNIT/ML MT SUSP
5.0000 mL | Freq: Four times a day (QID) | OROMUCOSAL | Status: DC
  Administered 2016-12-21 – 2016-12-23 (×7): 500000 [IU] via ORAL
  Filled 2016-12-21 (×9): qty 5

## 2016-12-21 MED ORDER — DIPHENHYDRAMINE HCL 25 MG PO CAPS
25.0000 mg | ORAL_CAPSULE | Freq: Four times a day (QID) | ORAL | Status: DC | PRN
  Administered 2016-12-21: 25 mg via ORAL
  Filled 2016-12-21: qty 1

## 2016-12-21 MED ORDER — IPRATROPIUM-ALBUTEROL 0.5-2.5 (3) MG/3ML IN SOLN
3.0000 mL | RESPIRATORY_TRACT | Status: DC
  Administered 2016-12-22 – 2016-12-24 (×16): 3 mL via RESPIRATORY_TRACT
  Filled 2016-12-21 (×16): qty 3

## 2016-12-21 MED ORDER — DOXYCYCLINE HYCLATE 100 MG PO TABS
100.0000 mg | ORAL_TABLET | Freq: Two times a day (BID) | ORAL | Status: DC
  Administered 2016-12-21 – 2016-12-24 (×6): 100 mg via ORAL
  Filled 2016-12-21 (×7): qty 1

## 2016-12-21 NOTE — Progress Notes (Signed)
Progress Note  Patient Name: Leslie Green Date of Encounter: 12/21/2016  Primary Cardiologist: Diona Browner  Subjective   Short of breath with exertion. Denies chest pain.  Inpatient Medications    Scheduled Meds: . aspirin EC  325 mg Oral Daily  . heparin  5,000 Units Subcutaneous Q8H  . ipratropium-albuterol  3 mL Nebulization TID  . methylPREDNISolone (SOLU-MEDROL) injection  125 mg Intravenous Q12H   Continuous Infusions:  PRN Meds: acetaminophen, diphenhydrAMINE, ipratropium-albuterol, nitroGLYCERIN, ondansetron (ZOFRAN) IV   Vital Signs    Vitals:   12/21/16 0229 12/21/16 0245 12/21/16 0645 12/21/16 1015  BP:  (!) 143/61 131/66   Pulse:  94 88   Resp:  18 18   Temp:  97.9 F (36.6 C) 97.8 F (36.6 C)   TempSrc:  Oral Oral   SpO2: 93% 94% 96% 92%  Weight:      Height:        Intake/Output Summary (Last 24 hours) at 12/21/16 1235 Last data filed at 12/21/16 0900  Gross per 24 hour  Intake              480 ml  Output                0 ml  Net              480 ml   Filed Weights   12/20/16 0928 12/20/16 1805  Weight: 220 lb (99.8 kg) 226 lb 6.4 oz (102.7 kg)    Telemetry    NSR- Personally Reviewed  ECG      Physical Exam   GEN: No acute distress.   Neck: No JVD Cardiac: RRR, no murmurs, rubs, or gallops.  Respiratory: B/l insp/exp wheezes and rhonchi, diminished at bases. GI: Soft, nontender, non-distended  MS: No edema; No deformity. Neuro:  Nonfocal  Psych: Normal affect   Labs    Chemistry Recent Labs Lab 12/20/16 0945  NA 134*  K 3.7  CL 95*  CO2 32  GLUCOSE 122*  BUN 18  CREATININE 1.11  CALCIUM 8.5*  GFRNONAA >60  GFRAA >60  ANIONGAP 7     Hematology Recent Labs Lab 12/20/16 0945  WBC 14.4*  RBC 5.30  HGB 16.8  HCT 52.1*  MCV 98.3  MCH 31.7  MCHC 32.2  RDW 13.6  PLT 194    Cardiac Enzymes Recent Labs Lab 12/20/16 0945 12/20/16 1630 12/20/16 1849 12/20/16 2218  TROPONINI 0.17* 0.12* 0.12* 0.11*   No  results for input(s): TROPIPOC in the last 168 hours.   BNP Recent Labs Lab 12/20/16 0945  BNP 361.0*     DDimer No results for input(s): DDIMER in the last 168 hours.   Radiology    Dg Chest 2 View  Result Date: 12/20/2016 CLINICAL DATA:  Cough and congestion. EXAM: CHEST  2 VIEW COMPARISON:  Chest x-ray 12/11/2001 . FINDINGS: Mediastinum hilar structures are normal. Cardiomegaly with mild pulmonary vascular prominence. Diffuse bilateral pulmonary interstitial prominence noted. These changes may be related to interstitial edema and/or pneumonitis. Underlying chronic interstitial lung disease may be present. Chronic interstitial lung disease was described on prior chest x-ray report of 12/11/2001. No pneumothorax. IMPRESSION: 1. Cardiomegaly with mild pulmonary venous congestion. 2. Diffuse bilateral pulmonary interstitial prominence noted. These changes may be related to interstitial edema and/or pneumonitis. Underlying chronic interstitial lung disease may be present. Chronic interstitial lung disease described on prior chest x-ray report of 12/11/2001. Follow-up chest x-ray can be obtained for continued evaluation . Electronically Signed  ByMaisie Fus  Register   On: 12/20/2016 10:46   Ct Chest Wo Contrast  Result Date: 12/20/2016 CLINICAL DATA:  Cough and wheezing for several weeks. Long-standing history of asthma. EXAM: CT CHEST WITHOUT CONTRAST TECHNIQUE: Multidetector CT imaging of the chest was performed following the standard protocol without IV contrast. COMPARISON:  PA and lateral chest 12/20/2016. FINDINGS: Cardiovascular: Heart size is normal. No pericardial effusion. No calcific atherosclerosis. Mediastinum/Nodes: Scattered small mediastinal lymph nodes are identified including a pretracheal node on image 51 measuring 1.1 cm and a prevascular node on image 54 measuring 1.2 cm short axis dimension. No definite hilar adenopathy is identified. Lungs/Pleura: No pleural effusion. Extensive  bullous or cystic change is identified with a predominance in the lower lobes. Milder degree of similar change in the right middle lobe and lingula is identified. The upper lobes are spared. A few scattered subcentimeter pulmonary nodules such as a 0.5 cm nodule on image 49 and likely related to remote infectious or inflammatory process. No consolidative process or pneumothorax. Upper Abdomen: Negative. Musculoskeletal: No fracture or focal lesion. IMPRESSION: Extensive bullous or cystic change with a lower lobe predominance is not typical of emphysema associated with smoking and can be seen in alpha 1 antitrypsin deficiency and ingestion of certain drugs such as Ritalin. Mild mediastinal lymphadenopathy is nonspecific but likely reactive. Electronically Signed   By: Drusilla Kanner M.D.   On: 12/20/2016 17:27    Cardiac Studies   Echocardiogram (12/21/16)  - Left ventricle: The cavity size was normal. Wall thickness was   increased in a pattern of mild LVH. Systolic function was   vigorous. The estimated ejection fraction was in the range of 65%   to 70%. Wall motion was normal; there were no regional wall   motion abnormalities. Left ventricular diastolic function   parameters were normal. - Aortic valve: Peak velocity (S): 229 cm/s. Elevated outflow tract   gradient likely secondary to hyperdynamic LV function. Mean   gradient (S): 11 mm Hg. - Systemic veins: IVC is dilated with normal respiratory variation.   Estimated CVP 8 mmHg.  Patient Profile     56 y.o. male with a history of asthma, allergic rhinitis, and recurring pneumonia, being evaluated for abnormal troponin I level and pulmonary venous congestion.  Assessment & Plan    1. SOB/acute hypoxic respiratory failure: Symptomatically improved. This is due to underlying pulmonary process with bullous/cystic changes seen on CT with features of COPD exacerbation and perhaps bronchitis. He does not have CHF. LV systolic and diastolic  function are normal.  2. Troponin elevation/myocardial strain: No ACS. LV systolic function and regional wall motion are normal. No further workup indicated.  Will sign off.  Signed, Prentice Docker, MD  12/21/2016, 12:35 PM

## 2016-12-21 NOTE — Care Management Note (Signed)
Case Management Note  Patient Details  Name: Leslie Green MRN: [id number] Date of Birth: 05/12/1960  Subjective/Objective:                  Admitted with COPD exacerbation. Pt from home, lives with fiance in [place]. He is unemployed, uninsured. He has PCP at [facility] clinic in [place]. He has home oxygen through [place]. He has neb machine that is not working properly. Pt needs new one, has no preference of DME provider. Do not anticipate any new medications at DC.   Action/Plan: Plan for DC home with self care, PCP follow up. Neb machine has been ordered. Bonita Quin, [facility] rep, given referral and will obtain pt info from chart and deliver to pt room prior to DC.   Expected Discharge Date:     12/21/2016             Expected Discharge Plan:  Home/Self Care  In-House Referral:  NA  Discharge planning Services  CM Consult  Post Acute Care Choice:  Durable Medical Equipment Choice offered to:  Patient  DME Arranged:  Nebulizer machine DME Agency:  [facility]  Status of Service:  Completed, signed off  Malcolm Metro, RN 12/21/2016, 2:14 PM

## 2016-12-21 NOTE — Progress Notes (Signed)
PROGRESS NOTE    Leslie Green  GNO:[id number] DOB: October 01, 1960 DOA: 12/20/2016 PCP: System, Provider Not In    Brief Narrative:  56 year old male with history of COPD who recently quit tobacco, presented with shortness of breath and found to have COPD exacerbation. Admitted for the treatment with steroids, antibiotics and nebulizer treatments. Also had mildly elevated troponin consistent with demand ischemia. Seen by cardiology.   Assessment & Plan:   Active Problems:   Acute respiratory failure (HCC)   COPD (chronic obstructive pulmonary disease) (HCC)   Demand ischemia (HCC)   Oral thrush   1. Acute respiratory failure with hypoxia. Related to COPD exacerbation. Currently off oxygen as tolerated. 2. COPD exacerbation. Still has some wheezing. Continue on IV steroids, add doxycycline, Mucinex. Continue on bronchodilators. 3. Demand ischemia. Likely strain related to pulmonary issues. Echocardiogram does not show any wall motion abnormalities and ejection fraction is normal. Cardiology following. No further workup planned. 4. Oral thrush . on nystatin and fluconazole.   DVT prophylaxis: heparin Code Status: full code Family Communication: discussed with wife at the bedside Disposition Plan: discharge home once improved   Consultants:   cardiology  Procedures:  Echo:- Left ventricle: The cavity size was normal. Wall thickness was   increased in a pattern of mild LVH. Systolic function was   vigorous. The estimated ejection fraction was in the range of 65%   to 70%. Wall motion was normal; there were no regional wall   motion abnormalities. Left ventricular diastolic function   parameters were normal. - Aortic valve: Peak velocity (S): 229 cm/s. Elevated outflow tract   gradient likely secondary to hyperdynamic LV function. Mean   gradient (S): 11 mm Hg. - Systemic veins: IVC is dilated with normal respiratory variation.    Estimated CVP 8 mmHg.   Antimicrobials:    Doxycycline 8/23>>    Subjective: Breathing is better today, but not back to baseline. No chest pain  Objective: Vitals:   12/21/16 1445 12/21/16 1500 12/21/16 1808 12/21/16 1845  BP: 131/67 134/70  129/71  Pulse: 87 86  89  Resp: 19 19  20   Temp: 98 F (36.7 C) 98.5 F (36.9 C)  98.3 F (36.8 C)  TempSrc: Oral Oral  Oral  SpO2: 94% 95% 98% 97%  Weight:      Height:        Intake/Output Summary (Last 24 hours) at 12/21/16 1857 Last data filed at 12/21/16 1852  Gross per 24 hour  Intake             1510 ml  Output             1950 ml  Net             -440 ml   Filed Weights   12/20/16 0928 12/20/16 1805  Weight: 99.8 kg (220 lb) 102.7 kg (226 lb 6.4 oz)    Examination:  General exam: Appears calm and comfortable  Respiratory system: mild wheeze bilaterally. Respiratory effort normal. Cardiovascular system: S1 & S2 heard, RRR. No JVD, murmurs, rubs, gallops or clicks. No pedal edema. Gastrointestinal system: Abdomen is nondistended, soft and nontender. No organomegaly or masses felt. Normal bowel sounds heard. Central nervous system: Alert and oriented. No focal neurological deficits. Extremities: Symmetric 5 x 5 power. Skin: No rashes, lesions or ulcers Psychiatry: Judgement and insight appear normal. Mood & affect appropriate.     Data Reviewed: I have personally reviewed following labs and imaging studies  CBC:  Recent Labs  Lab 12/20/16 0945  WBC 14.4*  NEUTROABS 10.6*  HGB 16.8  HCT 52.1*  MCV 98.3  PLT 194   Basic Metabolic Panel:  Recent Labs Lab 12/20/16 0945  NA 134*  K 3.7  CL 95*  CO2 32  GLUCOSE 122*  BUN 18  CREATININE 1.11  CALCIUM 8.5*   GFR: Estimated Creatinine Clearance: 92.9 mL/min (by C-G formula based on SCr of 1.11 mg/dL). Liver Function Tests: No results for input(s): AST, ALT, ALKPHOS, BILITOT, PROT, ALBUMIN in the last 168 hours. No results for input(s): LIPASE, AMYLASE in the last 168 hours. No results for  input(s): AMMONIA in the last 168 hours. Coagulation Profile: No results for input(s): INR, PROTIME in the last 168 hours. Cardiac Enzymes:  Recent Labs Lab 12/20/16 0945 12/20/16 1630 12/20/16 1849 12/20/16 2218  TROPONINI 0.17* 0.12* 0.12* 0.11*   BNP (last 3 results) No results for input(s): PROBNP in the last 8760 hours. HbA1C: No results for input(s): HGBA1C in the last 72 hours. CBG: No results for input(s): GLUCAP in the last 168 hours. Lipid Profile: No results for input(s): CHOL, HDL, LDLCALC, TRIG, CHOLHDL, LDLDIRECT in the last 72 hours. Thyroid Function Tests: No results for input(s): TSH, T4TOTAL, FREET4, T3FREE, THYROIDAB in the last 72 hours. Anemia Panel: No results for input(s): VITAMINB12, FOLATE, FERRITIN, TIBC, IRON, RETICCTPCT in the last 72 hours. Sepsis Labs: No results for input(s): PROCALCITON, LATICACIDVEN in the last 168 hours.  No results found for this or any previous visit (from the past 240 hour(s)).       Radiology Studies: Dg Chest 2 View  Result Date: 12/20/2016 CLINICAL DATA:  Cough and congestion. EXAM: CHEST  2 VIEW COMPARISON:  Chest x-ray 12/11/2001 . FINDINGS: Mediastinum hilar structures are normal. Cardiomegaly with mild pulmonary vascular prominence. Diffuse bilateral pulmonary interstitial prominence noted. These changes may be related to interstitial edema and/or pneumonitis. Underlying chronic interstitial lung disease may be present. Chronic interstitial lung disease was described on prior chest x-ray report of 12/11/2001. No pneumothorax. IMPRESSION: 1. Cardiomegaly with mild pulmonary venous congestion. 2. Diffuse bilateral pulmonary interstitial prominence noted. These changes may be related to interstitial edema and/or pneumonitis. Underlying chronic interstitial lung disease may be present. Chronic interstitial lung disease described on prior chest x-ray report of 12/11/2001. Follow-up chest x-ray can be obtained for continued  evaluation . Electronically Signed   By: Maisie Fus  Register   On: 12/20/2016 10:46   Ct Chest Wo Contrast  Result Date: 12/20/2016 CLINICAL DATA:  Cough and wheezing for several weeks. Long-standing history of asthma. EXAM: CT CHEST WITHOUT CONTRAST TECHNIQUE: Multidetector CT imaging of the chest was performed following the standard protocol without IV contrast. COMPARISON:  PA and lateral chest 12/20/2016. FINDINGS: Cardiovascular: Heart size is normal. No pericardial effusion. No calcific atherosclerosis. Mediastinum/Nodes: Scattered small mediastinal lymph nodes are identified including a pretracheal node on image 51 measuring 1.1 cm and a prevascular node on image 54 measuring 1.2 cm short axis dimension. No definite hilar adenopathy is identified. Lungs/Pleura: No pleural effusion. Extensive bullous or cystic change is identified with a predominance in the lower lobes. Milder degree of similar change in the right middle lobe and lingula is identified. The upper lobes are spared. A few scattered subcentimeter pulmonary nodules such as a 0.5 cm nodule on image 49 and likely related to remote infectious or inflammatory process. No consolidative process or pneumothorax. Upper Abdomen: Negative. Musculoskeletal: No fracture or focal lesion. IMPRESSION: Extensive bullous or cystic change with a lower  lobe predominance is not typical of emphysema associated with smoking and can be seen in alpha 1 antitrypsin deficiency and ingestion of certain drugs such as Ritalin. Mild mediastinal lymphadenopathy is nonspecific but likely reactive. Electronically Signed   By: Drusilla Kanner M.D.   On: 12/20/2016 17:27        Scheduled Meds: . aspirin EC  325 mg Oral Daily  . doxycycline  100 mg Oral Q12H  . fluconazole  100 mg Oral Daily  . guaiFENesin  1,200 mg Oral BID  . heparin  5,000 Units Subcutaneous Q8H  . ipratropium-albuterol  3 mL Nebulization TID  . methylPREDNISolone (SOLU-MEDROL) injection  60 mg  Intravenous Q6H  . nystatin  5 mL Oral QID   Continuous Infusions:   LOS: 1 day    Time spent:    MEMON,JEHANZEB, MD [facility] Pager [phone]  If 7PM-7AM, please contact night-coverage [email or web address] Password [facility] 12/21/2016, 6:57 PM

## 2016-12-21 NOTE — Progress Notes (Signed)
*  PRELIMINARY RESULTS* Echocardiogram 2D Echocardiogram has been performed.  Stacey Drain 12/21/2016, 12:11 PM

## 2016-12-22 DIAGNOSIS — I5031 Acute diastolic (congestive) heart failure: Secondary | ICD-10-CM

## 2016-12-22 LAB — GLUCOSE, CAPILLARY: GLUCOSE-CAPILLARY: 134 mg/dL — AB (ref 65–99)

## 2016-12-22 MED ORDER — FUROSEMIDE 10 MG/ML IJ SOLN
20.0000 mg | Freq: Once | INTRAMUSCULAR | Status: AC
  Administered 2016-12-22: 20 mg via INTRAVENOUS
  Filled 2016-12-22: qty 2

## 2016-12-22 MED ORDER — POTASSIUM CHLORIDE CRYS ER 20 MEQ PO TBCR
40.0000 meq | EXTENDED_RELEASE_TABLET | Freq: Once | ORAL | Status: AC
  Administered 2016-12-22: 40 meq via ORAL
  Filled 2016-12-22: qty 2

## 2016-12-22 MED ORDER — FUROSEMIDE 10 MG/ML IJ SOLN
20.0000 mg | Freq: Once | INTRAMUSCULAR | Status: AC
  Administered 2016-12-22: 20 mg via INTRAVENOUS

## 2016-12-22 MED ORDER — FUROSEMIDE 10 MG/ML IJ SOLN
INTRAMUSCULAR | Status: AC
  Administered 2016-12-22: 20 mg via INTRAVENOUS
  Filled 2016-12-22: qty 4

## 2016-12-22 MED ORDER — ALBUTEROL SULFATE (2.5 MG/3ML) 0.083% IN NEBU
2.5000 mg | INHALATION_SOLUTION | RESPIRATORY_TRACT | Status: DC | PRN
  Administered 2016-12-22 – 2016-12-24 (×3): 2.5 mg via RESPIRATORY_TRACT
  Filled 2016-12-22 (×4): qty 3

## 2016-12-22 NOTE — Progress Notes (Signed)
Called by RN and the patient still complaining of shortness of breath despite getting DuoNeb's at 4 AM, patient was admitted with COPD exacerbation started on steroids, doxycycline.   Went and examined the patient. History complains of shortness of breath and says that breathing treatment is not helping him. Reviewed the note from cardiology, he does have mild elevation of troponin. Chest x-ray showed cardiomegaly and mild pulmonary vascular congestion.  On exam Bilateral wheezing in anterior lung fields,  crackles auscultated at lung bases  Plan One  dose of Lasix 20 g IV. Continue Solu-Medrol, DuoNeb's   Time spent 35 minutes

## 2016-12-22 NOTE — Progress Notes (Signed)
PROGRESS NOTE    Leslie Green  DEY:[id number] DOB: April 24, 1961 DOA: 12/20/2016 PCP: System, Provider Not In    Brief Narrative:  56 year old male with history of COPD who recently quit tobacco, presented with shortness of breath and found to have COPD exacerbation. Admitted for the treatment with steroids, antibiotics and nebulizer treatments. Also had mildly elevated troponin consistent with demand ischemia. Seen by cardiology.   Assessment & Plan:   Active Problems:   Acute respiratory failure (HCC)   COPD (chronic obstructive pulmonary disease) (HCC)   Demand ischemia (HCC)   Oral thrush   1. Acute respiratory failure with hypoxia. Related to COPD exacerbation. Wean off oxygen as tolerated. 2. COPD exacerbation. Still has some wheezing. Continue on IV steroids, add doxycycline, Mucinex. Continue on bronchodilators. 3. Acute diastolic chf. Mildly elevated BNP on admission. Shortness of breath improving after receiving IV lasix. Volume status is -1.1L since admission. He has had 2.5L of urine output today. Will give another dose of lasix this evening.  4. Demand ischemia. Likely strain related to pulmonary issues. Echocardiogram does not show any wall motion abnormalities and ejection fraction is normal. Cardiology following. No further workup planned. 5. Oral thrush . on nystatin and fluconazole.   DVT prophylaxis: heparin Code Status: full code Family Communication: discussed with wife at the bedside Disposition Plan: discharge home once improved   Consultants:   cardiology  Procedures:  Echo:- Left ventricle: The cavity size was normal. Wall thickness was   increased in a pattern of mild LVH. Systolic function was   vigorous. The estimated ejection fraction was in the range of 65%   to 70%. Wall motion was normal; there were no regional wall   motion abnormalities. Left ventricular diastolic function   parameters were normal. - Aortic valve: Peak velocity (S): 229 cm/s.  Elevated outflow tract   gradient likely secondary to hyperdynamic LV function. Mean   gradient (S): 11 mm Hg. - Systemic veins: IVC is dilated with normal respiratory variation.    Estimated CVP 8 mmHg.   Antimicrobials:   Doxycycline 8/23>>    Subjective: Had worsening shortness of breath overnight. Received lasix with some improvement in shortness of breath.  Objective: Vitals:   12/22/16 1017 12/22/16 1100 12/22/16 1330 12/22/16 1620  BP:   (!) 147/65   Pulse:   98   Resp:   18   Temp:   (!) 97.5 F (36.4 C)   TempSrc:   Oral   SpO2: 91% (!) 82% 90% 92%  Weight:      Height:        Intake/Output Summary (Last 24 hours) at 12/22/16 1841 Last data filed at 12/22/16 1816  Gross per 24 hour  Intake             1390 ml  Output             3030 ml  Net            -1640 ml   Filed Weights   12/20/16 0928 12/20/16 1805  Weight: 99.8 kg (220 lb) 102.7 kg (226 lb 6.4 oz)    Examination:  General exam: Appears calm and comfortable  Respiratory system: mild wheeze bilaterally. Diminished breath sounds. Respiratory effort normal. Cardiovascular system: S1 & S2 heard, RRR. No JVD, murmurs, rubs, gallops or clicks. No pedal edema. Gastrointestinal system: Abdomen is nondistended, soft and nontender. No organomegaly or masses felt. Normal bowel sounds heard. Central nervous system: Alert and oriented. No focal neurological deficits.  Extremities: Symmetric 5 x 5 power. Skin: No rashes, lesions or ulcers Psychiatry: Judgement and insight appear normal. Mood & affect appropriate.     Data Reviewed: I have personally reviewed following labs and imaging studies  CBC:  Recent Labs Lab 12/20/16 0945  WBC 14.4*  NEUTROABS 10.6*  HGB 16.8  HCT 52.1*  MCV 98.3  PLT 194   Basic Metabolic Panel:  Recent Labs Lab 12/20/16 0945  NA 134*  K 3.7  CL 95*  CO2 32  GLUCOSE 122*  BUN 18  CREATININE 1.11  CALCIUM 8.5*   GFR: Estimated Creatinine Clearance: 92.9  mL/min (by C-G formula based on SCr of 1.11 mg/dL). Liver Function Tests: No results for input(s): AST, ALT, ALKPHOS, BILITOT, PROT, ALBUMIN in the last 168 hours. No results for input(s): LIPASE, AMYLASE in the last 168 hours. No results for input(s): AMMONIA in the last 168 hours. Coagulation Profile: No results for input(s): INR, PROTIME in the last 168 hours. Cardiac Enzymes:  Recent Labs Lab 12/20/16 0945 12/20/16 1630 12/20/16 1849 12/20/16 2218  TROPONINI 0.17* 0.12* 0.12* 0.11*   BNP (last 3 results) No results for input(s): PROBNP in the last 8760 hours. HbA1C: No results for input(s): HGBA1C in the last 72 hours. CBG:  Recent Labs Lab 12/22/16 0800  GLUCAP 134*   Lipid Profile: No results for input(s): CHOL, HDL, LDLCALC, TRIG, CHOLHDL, LDLDIRECT in the last 72 hours. Thyroid Function Tests: No results for input(s): TSH, T4TOTAL, FREET4, T3FREE, THYROIDAB in the last 72 hours. Anemia Panel: No results for input(s): VITAMINB12, FOLATE, FERRITIN, TIBC, IRON, RETICCTPCT in the last 72 hours. Sepsis Labs: No results for input(s): PROCALCITON, LATICACIDVEN in the last 168 hours.  No results found for this or any previous visit (from the past 240 hour(s)).       Radiology Studies: No results found.      Scheduled Meds: . aspirin EC  325 mg Oral Daily  . doxycycline  100 mg Oral Q12H  . fluconazole  100 mg Oral Daily  . furosemide  20 mg Intravenous Once  . guaiFENesin  1,200 mg Oral BID  . heparin  5,000 Units Subcutaneous Q8H  . ipratropium-albuterol  3 mL Nebulization Q4H  . methylPREDNISolone (SOLU-MEDROL) injection  60 mg Intravenous Q6H  . nystatin  5 mL Oral QID  . potassium chloride  40 mEq Oral Once   Continuous Infusions:   LOS: 2 days    Time spent:    Leslie Ardis, MD [facility] Pager [phone]  If 7PM-7AM, please contact night-coverage [email or web address] Password [facility] 12/22/2016, 6:41 PM

## 2016-12-23 ENCOUNTER — Inpatient Hospital Stay (HOSPITAL_COMMUNITY): Payer: Self-pay

## 2016-12-23 LAB — BASIC METABOLIC PANEL
Anion gap: 7 (ref 5–15)
BUN: 34 mg/dL — ABNORMAL HIGH (ref 6–20)
CHLORIDE: 94 mmol/L — AB (ref 101–111)
CO2: 36 mmol/L — ABNORMAL HIGH (ref 22–32)
Calcium: 8.3 mg/dL — ABNORMAL LOW (ref 8.9–10.3)
Creatinine, Ser: 1.11 mg/dL (ref 0.61–1.24)
GFR calc non Af Amer: >60 mL/min (ref >60)
Glucose, Bld: 144 mg/dL — ABNORMAL HIGH (ref 65–99)
POTASSIUM: 5 mmol/L (ref 3.5–5.1)
SODIUM: 137 mmol/L (ref 135–145)

## 2016-12-23 MED ORDER — FLUTICASONE FUROATE-VILANTEROL 200-25 MCG/INH IN AEPB
1.0000 | INHALATION_SPRAY | Freq: Every day | RESPIRATORY_TRACT | Status: DC
  Administered 2016-12-23 – 2016-12-24 (×2): 1 via RESPIRATORY_TRACT
  Filled 2016-12-23: qty 28

## 2016-12-23 MED ORDER — MAGNESIUM CITRATE PO SOLN
1.0000 | Freq: Once | ORAL | Status: AC
  Administered 2016-12-23: 1 via ORAL
  Filled 2016-12-23: qty 296

## 2016-12-23 MED ORDER — FUROSEMIDE 10 MG/ML IJ SOLN
40.0000 mg | Freq: Two times a day (BID) | INTRAMUSCULAR | Status: DC
  Administered 2016-12-23 – 2016-12-24 (×3): 40 mg via INTRAVENOUS
  Filled 2016-12-23 (×3): qty 4

## 2016-12-23 NOTE — Progress Notes (Signed)
PROGRESS NOTE    Leslie Green  JHE:[id number] DOB: [birth year] DOA: 12/20/2016 PCP: System, Provider Not In    Brief Narrative:  56 year old male with history of COPD who recently quit tobacco, presented with shortness of breath and found to have COPD exacerbation. Admitted for the treatment with steroids, antibiotics and nebulizer treatments. Also had mildly elevated troponin consistent with demand ischemia. Seen by cardiology.   Assessment & Plan:   Active Problems:   Acute respiratory failure (HCC)   COPD (chronic obstructive pulmonary disease) (HCC)   Demand ischemia (HCC)   Oral thrush   1. Acute respiratory failure with hypoxia. Related to COPD exacerbation and possible CHF. Wean off oxygen as tolerated. 2. COPD exacerbation. Continues to be short of breath and wheezing. Continue on IV steroids,  doxycycline, Mucinex. Continue on bronchodilators. Will request pulmonology input. CT chest had shown findings concerning for alpha 1 antitrypsin deficiency. Patient reports he has just started seeing a pulmonologist in [place], [place].  3. Acute diastolic chf. Mildly elevated BNP on admission. Shortness of breath improving after receiving IV lasix. Volume status is -1.1L since admission. Repeat chest xray today. Continue on lasix.  4. Demand ischemia. Likely strain related to pulmonary issues. Echocardiogram does not show any wall motion abnormalities and ejection fraction is normal. Cardiology following. No further workup planned. 5. Oral thrush . on nystatin and fluconazole.   DVT prophylaxis: heparin Code Status: full code Family Communication: no family present Disposition Plan: discharge home once improved   Consultants:   cardiology  Procedures:  Echo:- Left ventricle: The cavity size was normal. Wall thickness was   increased in a pattern of mild LVH. Systolic function was   vigorous. The estimated ejection fraction was in the range of 65%   to 70%. Wall motion was normal;  there were no regional wall   motion abnormalities. Left ventricular diastolic function   parameters were normal. - Aortic valve: Peak velocity (S): 229 cm/s. Elevated outflow tract   gradient likely secondary to hyperdynamic LV function. Mean   gradient (S): 11 mm Hg. - Systemic veins: IVC is dilated with normal respiratory variation.    Estimated CVP 8 mmHg.   Antimicrobials:   Doxycycline 8/23>>    Subjective: Still very short of breath. Wheezing. Breathing worse on exertion  Objective: Vitals:   12/23/16 0222 12/23/16 0450 12/23/16 0600 12/23/16 0808  BP:   139/68   Pulse:   94   Resp:      Temp:   98.1 F (36.7 C)   TempSrc:   Oral   SpO2: 96% 97% 97% 95%  Weight:      Height:        Intake/Output Summary (Last 24 hours) at 12/23/16 0834 Last data filed at 12/23/16 0400  Gross per 24 hour  Intake             2720 ml  Output             3180 ml  Net             -460 ml   Filed Weights   12/20/16 0928 12/20/16 1805  Weight: 99.8 kg (220 lb) 102.7 kg (226 lb 6.4 oz)    Examination:  General exam: Appears calm and comfortable  Respiratory system: diminished breath sounds with wheeze bilaterally. Increased respiratory effort Cardiovascular system: S1 & S2 heard, RRR. No JVD, murmurs, rubs, gallops or clicks. No pedal edema. Gastrointestinal system: Abdomen is nondistended, soft and nontender. No organomegaly or  masses felt. Normal bowel sounds heard. Central nervous system: Alert and oriented. No focal neurological deficits. Extremities: Symmetric 5 x 5 power. Skin: No rashes, lesions or ulcers Psychiatry: Judgement and insight appear normal. Mood & affect appropriate.     Data Reviewed: I have personally reviewed following labs and imaging studies  CBC:  Recent Labs Lab 12/20/16 0945  WBC 14.4*  NEUTROABS 10.6*  HGB 16.8  HCT 52.1*  MCV 98.3  PLT 194   Basic Metabolic Panel:  Recent Labs Lab 12/20/16 0945 12/23/16 0655  NA 134* 137  K 3.7  5.0  CL 95* 94*  CO2 32 36*  GLUCOSE 122* 144*  BUN 18 34*  CREATININE 1.11 1.11  CALCIUM 8.5* 8.3*   GFR: Estimated Creatinine Clearance: 92.9 mL/min (by C-G formula based on SCr of 1.11 mg/dL). Liver Function Tests: No results for input(s): AST, ALT, ALKPHOS, BILITOT, PROT, ALBUMIN in the last 168 hours. No results for input(s): LIPASE, AMYLASE in the last 168 hours. No results for input(s): AMMONIA in the last 168 hours. Coagulation Profile: No results for input(s): INR, PROTIME in the last 168 hours. Cardiac Enzymes:  Recent Labs Lab 12/20/16 0945 12/20/16 1630 12/20/16 1849 12/20/16 2218  TROPONINI 0.17* 0.12* 0.12* 0.11*   BNP (last 3 results) No results for input(s): PROBNP in the last 8760 hours. HbA1C: No results for input(s): HGBA1C in the last 72 hours. CBG:  Recent Labs Lab 12/22/16 0800  GLUCAP 134*   Lipid Profile: No results for input(s): CHOL, HDL, LDLCALC, TRIG, CHOLHDL, LDLDIRECT in the last 72 hours. Thyroid Function Tests: No results for input(s): TSH, T4TOTAL, FREET4, T3FREE, THYROIDAB in the last 72 hours. Anemia Panel: No results for input(s): VITAMINB12, FOLATE, FERRITIN, TIBC, IRON, RETICCTPCT in the last 72 hours. Sepsis Labs: No results for input(s): PROCALCITON, LATICACIDVEN in the last 168 hours.  No results found for this or any previous visit (from the past 240 hour(s)).       Radiology Studies: No results found.      Scheduled Meds: . aspirin EC  325 mg Oral Daily  . doxycycline  100 mg Oral Q12H  . fluconazole  100 mg Oral Daily  . furosemide  40 mg Intravenous BID  . guaiFENesin  1,200 mg Oral BID  . heparin  5,000 Units Subcutaneous Q8H  . ipratropium-albuterol  3 mL Nebulization Q4H  . magnesium citrate  1 Bottle Oral Once  . methylPREDNISolone (SOLU-MEDROL) injection  60 mg Intravenous Q6H  . nystatin  5 mL Oral QID   Continuous Infusions:   LOS: 3 days    Time spent:    [name],  MD [facility] Pager [phone]  If 7PM-7AM, please contact night-coverage [email or web address] Password [facility] 12/23/2016, 8:34 AM

## 2016-12-23 NOTE — Consult Note (Signed)
Consult requested by: [facility] Consult requested for: COPD  HPI: This is a 56 year old who has a history of COPD and who recently stopped smoking. He had elevated troponin thought to be demand ischemia. He may have some element of volume overload/heart failure. He has a substantially positive family history with his mother having asthma his father having COPD and his father died of lung cancer. He had asthma during childhood. He had a CT scan here which I personally reviewed and which strongly suggests alpha-1 anti-trypsin deficiency. However he is African-American and this is typically a problem in Caucasian's. He says he's been sick since about January when he had pneumonia and he's had more trouble since then. He is hypoxic with exertion. He is coughing nonproductively. He says he thinks he is constipated.  Past Medical History:  Diagnosis Date  . Allergic rhinitis   . Asthma   . Essential hypertension   . Recurrent pneumonia      Family History  Problem Relation Age of Onset  . Hypertension Mother   . Asthma Mother   . Colon cancer Father   . Lung cancer Father   . Diabetes Mellitus II Father   . Hypertension Father   . Hyperlipidemia Father   . CAD Maternal Grandmother   . CAD Maternal Uncle      Social History   Social History  . Marital status: Divorced    Spouse name: N/A  . Number of children: N/A  . Years of education: N/A   Social History Main Topics  . Smoking status: Former Games developer  . Smokeless tobacco: Never Used  . Alcohol use Yes     Comment: occasionally   . Drug use: No  . Sexual activity: Not Asked   Other Topics Concern  . None   Social History Narrative  . None     ROS: No hemoptysis. No night sweats. No weight loss in fact weight gain. Otherwise 10 point review of systems is negative    Objective: Vital signs in last 24 hours: Temp:  [97.5 F (36.4 C)-98.1 F (36.7 C)] 98.1 F (36.7 C) (08/25 0600) Pulse Rate:  [94-98] 94  (08/25 0600) Resp:  [18-20] 20 (08/24 2155) BP: (138-147)/(65-68) 139/68 (08/25 0600) SpO2:  [82 %-97 %] 95 % (08/25 0808) Weight change:  Last BM Date: 12/22/16  Intake/Output from previous day: 08/24 0701 - 08/25 0700 In: 2720 [P.O.:2720] Out: 3180 [Urine:3180]  PHYSICAL EXAM Constitutional: He is awake and alert and looks mildly short of breath. Cardiovascular: His heart is regular with normal heart sounds. Respiratory: He has bilateral rhonchi and wheezing. Gastrointestinal: His abdomen is soft with no masses. Skin: Warm and dry. Musculoskeletal: Normal strength. Neurological: No focal abnormalities. Psychiatric: He is mildly anxious. Eyes: Pupils react. Ears nose mouth and throat: Mucous membranes are moist  Lab Results: Basic Metabolic Panel:  Recent Labs  16/10/96 0655  NA 137  K 5.0  CL 94*  CO2 36*  GLUCOSE 144*  BUN 34*  CREATININE 1.11  CALCIUM 8.3*   Liver Function Tests: No results for input(s): AST, ALT, ALKPHOS, BILITOT, PROT, ALBUMIN in the last 72 hours. No results for input(s): LIPASE, AMYLASE in the last 72 hours. No results for input(s): AMMONIA in the last 72 hours. CBC: No results for input(s): WBC, NEUTROABS, HGB, HCT, MCV, PLT in the last 72 hours. Cardiac Enzymes:  Recent Labs  12/20/16 1630 12/20/16 1849 12/20/16 2218  TROPONINI 0.12* 0.12* 0.11*   BNP: No results for input(s):  PROBNP in the last 72 hours. D-Dimer: No results for input(s): DDIMER in the last 72 hours. CBG:  Recent Labs  12/22/16 0800  GLUCAP 134*   Hemoglobin A1C: No results for input(s): HGBA1C in the last 72 hours. Fasting Lipid Panel: No results for input(s): CHOL, HDL, LDLCALC, TRIG, CHOLHDL, LDLDIRECT in the last 72 hours. Thyroid Function Tests: No results for input(s): TSH, T4TOTAL, FREET4, T3FREE, THYROIDAB in the last 72 hours. Anemia Panel: No results for input(s): VITAMINB12, FOLATE, FERRITIN, TIBC, IRON, RETICCTPCT in the last 72  hours. Coagulation: No results for input(s): LABPROT, INR in the last 72 hours. Urine Drug Screen: Drugs of Abuse  No results found for: LABOPIA, COCAINSCRNUR, LABBENZ, AMPHETMU, THCU, LABBARB  Alcohol Level: No results for input(s): ETH in the last 72 hours. Urinalysis: No results for input(s): COLORURINE, LABSPEC, PHURINE, GLUCOSEU, HGBUR, BILIRUBINUR, KETONESUR, PROTEINUR, UROBILINOGEN, NITRITE, LEUKOCYTESUR in the last 72 hours.  Invalid input(s): APPERANCEUR Misc. Labs:   ABGS: No results for input(s): PHART, PO2ART, TCO2, HCO3 in the last 72 hours.  Invalid input(s): PCO2   MICROBIOLOGY: No results found for this or any previous visit (from the past 240 hour(s)).  Studies/Results: No results found.  Medications:  Prior to Admission:  Prescriptions Prior to Admission  Medication Sig Dispense Refill Last Dose  . albuterol (PROVENTIL HFA;VENTOLIN HFA) 108 (90 Base) MCG/ACT inhaler Inhale 1 puff into the lungs every 4 (four) hours as needed.     Marland Kitchen albuterol (PROVENTIL) (2.5 MG/3ML) 0.083% nebulizer solution Inhale 2.5 mg into the lungs 3 (three) times daily.   12/20/2016 at Unknown time  . fluticasone furoate-vilanterol (BREO ELLIPTA) 200-25 MCG/INH AEPB Inhale 1 puff into the lungs daily.   Past Month at Unknown time  . hydrochlorothiazide (HYDRODIURIL) 25 MG tablet Take 1 tablet by mouth daily.   12/20/2016 at Unknown time  . ipratropium (ATROVENT) 0.02 % nebulizer solution Inhale 0.5 mg into the lungs 4 (four) times daily.   12/20/2016 at Unknown time  . lovastatin (MEVACOR) 20 MG tablet Take 1 tablet by mouth every evening.   12/20/2016 at Unknown time  . mometasone (NASONEX) 50 MCG/ACT nasal spray Place 2 sprays into the nose daily.   12/20/2016 at Unknown time  . umeclidinium bromide (INCRUSE ELLIPTA) 62.5 MCG/INH AEPB Inhale 1 puff into the lungs daily.   Past Month at Unknown time   Scheduled: . aspirin EC  325 mg Oral Daily  . doxycycline  100 mg Oral Q12H  .  fluconazole  100 mg Oral Daily  . fluticasone furoate-vilanterol  1 puff Inhalation Daily  . furosemide  40 mg Intravenous BID  . guaiFENesin  1,200 mg Oral BID  . heparin  5,000 Units Subcutaneous Q8H  . ipratropium-albuterol  3 mL Nebulization Q4H  . methylPREDNISolone (SOLU-MEDROL) injection  60 mg Intravenous Q6H  . nystatin  5 mL Oral QID   Continuous:  ZOX:[id number], albuterol, diphenhydrAMINE, nitroGLYCERIN, ondansetron (ZOFRAN) IV  Assesment: He has acute hypoxic respiratory failure with COPD exacerbation. Although he is not Caucasian I'm going to go ahead and check him for alpha-1 anti-trypsin deficiency. The alpha-1 anti-trypsin level is something of an acute phase reactant so it may be falsely elevated in the hospital but I want to try to get some sort of a baseline. I'm also going to have him get phenotype testing done. Meantime restart 30. Active Problems:   Acute respiratory failure (HCC)   COPD (chronic obstructive pulmonary disease) (HCC)   Demand ischemia (HCC)   Oral thrush  Plan: As above    LOS: 3 days   Leslie Green L 12/23/2016, 10:46 AM

## 2016-12-23 NOTE — Plan of Care (Signed)
Problem: Activity: Goal: Risk for activity intolerance will decrease Outcome: Progressing Pt becomes increasingly SOB with activity. Pt not able to walk far without audibly wheezing and increased respirations. Multiple rest periods provided.

## 2016-12-24 MED ORDER — MILK AND MOLASSES ENEMA: 1.0000 | Freq: Once | RECTAL | Status: DC

## 2016-12-24 MED ORDER — POTASSIUM CHLORIDE ER 20 MEQ PO TBCR: 20.0000 meq | EXTENDED_RELEASE_TABLET | Freq: Every day | ORAL | 0 refills | Status: AC

## 2016-12-24 MED ORDER — FLUCONAZOLE 100 MG PO TABS: 100.0000 mg | ORAL_TABLET | Freq: Every day | ORAL | 0 refills | Status: AC

## 2016-12-24 MED ORDER — IPRATROPIUM BROMIDE 0.02 % IN SOLN: 0.5000 mg | Freq: Four times a day (QID) | RESPIRATORY_TRACT | 12 refills | Status: AC

## 2016-12-24 MED ORDER — DOXYCYCLINE HYCLATE 100 MG PO TABS: 100.0000 mg | ORAL_TABLET | Freq: Two times a day (BID) | ORAL | 0 refills | Status: AC

## 2016-12-24 MED ORDER — FUROSEMIDE 40 MG PO TABS: 40.0000 mg | ORAL_TABLET | Freq: Every day | ORAL | 0 refills | Status: AC

## 2016-12-24 MED ORDER — ALBUTEROL SULFATE HFA 108 (90 BASE) MCG/ACT IN AERS: 1.0000 | INHALATION_SPRAY | RESPIRATORY_TRACT | 1 refills | Status: AC | PRN

## 2016-12-24 MED ORDER — PREDNISONE 10 MG PO TABS: ORAL_TABLET | ORAL | 0 refills | Status: AC

## 2016-12-24 MED ORDER — ALBUTEROL SULFATE (2.5 MG/3ML) 0.083% IN NEBU: 2.5000 mg | INHALATION_SOLUTION | Freq: Three times a day (TID) | RESPIRATORY_TRACT | 12 refills | Status: AC

## 2016-12-24 MED ORDER — GUAIFENESIN ER 600 MG PO TB12: 600.0000 mg | ORAL_TABLET | Freq: Two times a day (BID) | ORAL | 0 refills | Status: AC

## 2016-12-24 NOTE — Progress Notes (Signed)
Subjective: He says he feels much better. Much less short of breath. He is still short of breath with exertion. He wants to go home.  Objective: Vital signs in last 24 hours: Temp:  [97.6 F (36.4 C)-98.1 F (36.7 C)] 97.9 F (36.6 C) (08/26 0519) Pulse Rate:  [92-95] 92 (08/26 0519) Resp:  [18-20] 18 (08/26 0519) BP: (123-152)/(51-74) 123/51 (08/26 0519) SpO2:  [92 %-100 %] 94 % (08/26 0805) Weight change:  Last BM Date: 12/24/16  Intake/Output from previous day: 08/25 0701 - 08/26 0700 In: 720 [P.O.:720] Out: 1400 [Urine:1400]  PHYSICAL EXAM General appearance: alert, cooperative and no distress Resp: rhonchi bilaterally Cardio: regular rate and rhythm, S1, S2 normal, no murmur, click, rub or gallop GI: soft, non-tender; bowel sounds normal; no masses,  no organomegaly Extremities: extremities normal, atraumatic, no cyanosis or edema Skin warm and dry  Lab Results:  Results for orders placed or performed during the hospital encounter of 12/20/16 (from the past 48 hour(s))  Basic metabolic panel     Status: Abnormal   Collection Time: 12/23/16  6:55 AM  Result Value Ref Range   Sodium 137 135 - 145 mmol/L   Potassium 5.0 3.5 - 5.1 mmol/L   Chloride 94 (L) 101 - 111 mmol/L   CO2 36 (H) 22 - 32 mmol/L   Glucose, Bld 144 (H) 65 - 99 mg/dL   BUN 34 (H) 6 - 20 mg/dL   Creatinine, Ser 1.11 0.61 - 1.24 mg/dL   Calcium 8.3 (L) 8.9 - 10.3 mg/dL   GFR calc non Af Amer >60 >60 mL/min   GFR calc Af Amer >60 >60 mL/min    Comment: (NOTE) The eGFR has been calculated using the CKD EPI equation. This calculation has not been validated in all clinical situations. eGFR's persistently <60 mL/min signify possible Chronic Kidney Disease.    Anion gap 7 5 - 15    ABGS No results for input(s): PHART, PO2ART, TCO2, HCO3 in the last 72 hours.  Invalid input(s): PCO2 CULTURES No results found for this or any previous visit (from the past 240 hour(s)). Studies/Results: Dg Chest 2  View  Result Date: 12/23/2016 CLINICAL DATA:  Shortness of breath for 4 days.  History of asthma. EXAM: CHEST  2 VIEW COMPARISON:  Chest radiograph and CT dated 12/20/2016 FINDINGS: Prominent lung markings with increased lucency in the lower lung fields. Findings compatible with chronic changes and similar to the other recent imaging. There is no significant change from the recent comparison examinations. No new areas of consolidation or airspace disease. Heart size is within normal limits. The trachea is midline. No large pleural effusions. No acute bone abnormality. IMPRESSION: Chronic lung changes.  No acute findings. Electronically Signed   By: Markus Daft M.D.   On: 12/23/2016 14:41    Medications:  Prior to Admission:  Prescriptions Prior to Admission  Medication Sig Dispense Refill Last Dose  . albuterol (PROVENTIL HFA;VENTOLIN HFA) 108 (90 Base) MCG/ACT inhaler Inhale 1 puff into the lungs every 4 (four) hours as needed.     Marland Kitchen albuterol (PROVENTIL) (2.5 MG/3ML) 0.083% nebulizer solution Inhale 2.5 mg into the lungs 3 (three) times daily.   12/20/2016 at Unknown time  . fluticasone furoate-vilanterol (BREO ELLIPTA) 200-25 MCG/INH AEPB Inhale 1 puff into the lungs daily.   Past Month at Unknown time  . hydrochlorothiazide (HYDRODIURIL) 25 MG tablet Take 1 tablet by mouth daily.   12/20/2016 at Unknown time  . ipratropium (ATROVENT) 0.02 % nebulizer solution  Inhale 0.5 mg into the lungs 4 (four) times daily.   12/20/2016 at Unknown time  . lovastatin (MEVACOR) 20 MG tablet Take 1 tablet by mouth every evening.   12/20/2016 at Unknown time  . mometasone (NASONEX) 50 MCG/ACT nasal spray Place 2 sprays into the nose daily.   12/20/2016 at Unknown time  . umeclidinium bromide (INCRUSE ELLIPTA) 62.5 MCG/INH AEPB Inhale 1 puff into the lungs daily.   Past Month at Unknown time   Scheduled: . aspirin EC  325 mg Oral Daily  . doxycycline  100 mg Oral Q12H  . fluconazole  100 mg Oral Daily  . fluticasone  furoate-vilanterol  1 puff Inhalation Daily  . furosemide  40 mg Intravenous BID  . guaiFENesin  1,200 mg Oral BID  . heparin  5,000 Units Subcutaneous Q8H  . ipratropium-albuterol  3 mL Nebulization Q4H  . methylPREDNISolone (SOLU-MEDROL) injection  60 mg Intravenous Q6H  . nystatin  5 mL Oral QID   Continuous:  BZM:[id number], albuterol, diphenhydrAMINE, nitroGLYCERIN, ondansetron (ZOFRAN) IV  Assesment: He was admitted with acute on chronic respiratory failure. He has COPD exacerbation. He has elevated troponin thought to be demand ischemia. He is substantially better than yesterday. He wants to go home. Active Problems:   Acute respiratory failure (HCC)   COPD (chronic obstructive pulmonary disease) (HCC)   Demand ischemia (HCC)   Oral thrush    Plan: I think he could be be potentially discharged from a pulmonary point of view. He has oxygen at home. He has inhalers at home. He will have to get approval from his primary care physician to follow-up in my office and he is going to see about that.    LOS: 4 days   Leslie Green L 12/24/2016, 8:07 AM

## 2016-12-24 NOTE — Progress Notes (Signed)
Discharge Planning:  Received call from Unit RN that pt is requesting a light weight portable oxygen. NCM spoke to pt via phone and explained he can follow up with his PCP and DME provider to get qualified for the light weight portable oxygen. Isidoro Donning RN CCM Case Mgmt phone [phone]

## 2016-12-24 NOTE — Discharge Summary (Signed)
Physician Discharge Summary  Hoyle Barkdull ZOX:[id number] DOB: [birth year] DOA: 12/20/2016  PCP: System, Provider Not In  Admit date: 12/20/2016 Discharge date: 12/24/2016  Admitted From: Home Disposition:  Home  Recommendations for Outpatient Follow-up:  1. Follow up with PCP in 1-2 weeks 2. Please obtain BMP/CBC in one week 3. Follow-up with pulmonology next 1-2 weeks  Home Health:[facility] Equipment/Devices:Oxygen 3 L  Discharge Condition:Stable CODE STATUS:Full code Diet recommendation: Heart Healthy  Brief/Interim Summary: 56 year old male with history of COPD who recently quit tobacco, presented with shortness of breath and found to have COPD exacerbation. Admitted for the treatment with steroids, antibiotics and nebulizer treatments. Also had mildly elevated troponin consistent with demand ischemia.   Discharge Diagnoses:  Active Problems:   Acute respiratory failure (HCC)   COPD (chronic obstructive pulmonary disease) (HCC)   Demand ischemia (HCC)   Oral thrush  1. Acute respiratory failure with hypoxia. Related to COPD exacerbation and possible CHF. Currently on 3 L oxygen. Patient wishes to continue to wean oxygen at home.. 2. COPD exacerbation. Treated with IV steroids,  doxycycline, Mucinex and bronchodilators. Pulmonology followed the patient. CT chest had shown findings concerning for alpha 1 antitrypsin deficiency. Overall, the patient has improved. He'll be placed on a prednisone taper and continued on bronchodilators at home. He plans to follow-up with pulmonology 3. Acute diastolic chf. Mildly elevated BNP on admission. Shortness of breath began improving after receiving IV lasix. Volume status is -1.5L since admission. Repeat chest showed improvement of venous congestion. Continued on oral Lasix at home   4. Demand ischemia. Likely strain related to pulmonary issues. Echocardiogram does not show any wall motion abnormalities and ejection fraction is normal. Cardiology  following. No further workup planned. 5. Oral thrush . on nystatin and fluconazole.  Discharge Instructions  Discharge Instructions    Diet - low sodium heart healthy    Complete by:  As directed    Increase activity slowly    Complete by:  As directed      Allergies as of 12/24/2016      Reactions   Chicken Allergy    Other    Pt states he cannot eat any "bird"    Theophyllines Other (See Comments)   Headache      Medication List    STOP taking these medications   hydrochlorothiazide 25 MG tablet Commonly known as:  HYDRODIURIL     TAKE these medications   albuterol 108 (90 Base) MCG/ACT inhaler Commonly known as:  PROVENTIL HFA;VENTOLIN HFA Inhale 1 puff into the lungs every 4 (four) hours as needed.   albuterol (2.5 MG/3ML) 0.083% nebulizer solution Commonly known as:  PROVENTIL Inhale 3 mLs (2.5 mg total) into the lungs 3 (three) times daily.   doxycycline 100 MG tablet Commonly known as:  VIBRA-TABS Take 1 tablet (100 mg total) by mouth every 12 (twelve) hours.   fluconazole 100 MG tablet Commonly known as:  DIFLUCAN Take 1 tablet (100 mg total) by mouth daily.   fluticasone furoate-vilanterol 200-25 MCG/INH Aepb Commonly known as:  BREO ELLIPTA Inhale 1 puff into the lungs daily.   furosemide 40 MG tablet Commonly known as:  LASIX Take 1 tablet (40 mg total) by mouth daily.   guaiFENesin 600 MG 12 hr tablet Commonly known as:  MUCINEX Take 1 tablet (600 mg total) by mouth 2 (two) times daily.   ipratropium 0.02 % nebulizer solution Commonly known as:  ATROVENT Inhale 2.5 mLs (0.5 mg total) into the lungs 4 (four) times  daily.   lovastatin 20 MG tablet Commonly known as:  MEVACOR Take 1 tablet by mouth every evening.   mometasone 50 MCG/ACT nasal spray Commonly known as:  NASONEX Place 2 sprays into the nose daily.   Potassium Chloride ER 20 MEQ Tbcr Take 20 mEq by mouth daily.   predniSONE 10 MG tablet Commonly known as:  DELTASONE Take  40mg  po daily for 2 days then 30mg  daily for 2 days then 20mg  daily for 2 days then 10mg  daily for 2 days then stop   umeclidinium bromide 62.5 MCG/INH Aepb Commonly known as:  INCRUSE ELLIPTA Inhale 1 puff into the lungs daily.            Discharge Care Instructions        Start     Ordered   12/25/16 0000  fluconazole (DIFLUCAN) 100 MG tablet  Daily     12/24/16 1334   12/24/16 0000  albuterol (PROVENTIL HFA;VENTOLIN HFA) 108 (90 Base) MCG/ACT inhaler  Every 4 hours PRN     12/24/16 1334   12/24/16 0000  albuterol (PROVENTIL) (2.5 MG/3ML) 0.083% nebulizer solution  3 times daily     12/24/16 1334   12/24/16 0000  doxycycline (VIBRA-TABS) 100 MG tablet  Every 12 hours     12/24/16 1334   12/24/16 0000  guaiFENesin (MUCINEX) 600 MG 12 hr tablet  2 times daily     12/24/16 1334   12/24/16 0000  ipratropium (ATROVENT) 0.02 % nebulizer solution  4 times daily     12/24/16 1334   12/24/16 0000  predniSONE (DELTASONE) 10 MG tablet     12/24/16 1334   12/24/16 0000  furosemide (LASIX) 40 MG tablet  Daily     12/24/16 1334   12/24/16 0000  potassium chloride 20 MEQ TBCR  Daily     12/24/16 1334   12/24/16 0000  Increase activity slowly     12/24/16 1334   12/24/16 0000  Diet - low sodium heart healthy     12/24/16 1334      Allergies  Allergen Reactions  . Chicken Allergy   . Other     Pt states he cannot eat any "bird"   . Theophyllines Other (See Comments)    Headache     Consultations:  Pulmonology  Cardiology   Procedures/Studies: Dg Chest 2 View  Result Date: 12/23/2016 CLINICAL DATA:  Shortness of breath for 4 days.  History of asthma. EXAM: CHEST  2 VIEW COMPARISON:  Chest radiograph and CT dated 12/20/2016 FINDINGS: Prominent lung markings with increased lucency in the lower lung fields. Findings compatible with chronic changes and similar to the other recent imaging. There is no significant change from the recent comparison examinations. No new areas of  consolidation or airspace disease. Heart size is within normal limits. The trachea is midline. No large pleural effusions. No acute bone abnormality. IMPRESSION: Chronic lung changes.  No acute findings. Electronically Signed   By: Richarda Overlie M.D.   On: 12/23/2016 14:41   Dg Chest 2 View  Result Date: 12/20/2016 CLINICAL DATA:  Cough and congestion. EXAM: CHEST  2 VIEW COMPARISON:  Chest x-ray 12/11/2001 . FINDINGS: Mediastinum hilar structures are normal. Cardiomegaly with mild pulmonary vascular prominence. Diffuse bilateral pulmonary interstitial prominence noted. These changes may be related to interstitial edema and/or pneumonitis. Underlying chronic interstitial lung disease may be present. Chronic interstitial lung disease was described on prior chest x-ray report of 12/11/2001. No pneumothorax. IMPRESSION: 1. Cardiomegaly with mild  pulmonary venous congestion. 2. Diffuse bilateral pulmonary interstitial prominence noted. These changes may be related to interstitial edema and/or pneumonitis. Underlying chronic interstitial lung disease may be present. Chronic interstitial lung disease described on prior chest x-ray report of 12/11/2001. Follow-up chest x-ray can be obtained for continued evaluation . Electronically Signed   By: Maisie Fus  Register   On: 12/20/2016 10:46   Ct Chest Wo Contrast  Result Date: 12/20/2016 CLINICAL DATA:  Cough and wheezing for several weeks. Long-standing history of asthma. EXAM: CT CHEST WITHOUT CONTRAST TECHNIQUE: Multidetector CT imaging of the chest was performed following the standard protocol without IV contrast. COMPARISON:  PA and lateral chest 12/20/2016. FINDINGS: Cardiovascular: Heart size is normal. No pericardial effusion. No calcific atherosclerosis. Mediastinum/Nodes: Scattered small mediastinal lymph nodes are identified including a pretracheal node on image 51 measuring 1.1 cm and a prevascular node on image 54 measuring 1.2 cm short axis dimension. No  definite hilar adenopathy is identified. Lungs/Pleura: No pleural effusion. Extensive bullous or cystic change is identified with a predominance in the lower lobes. Milder degree of similar change in the right middle lobe and lingula is identified. The upper lobes are spared. A few scattered subcentimeter pulmonary nodules such as a 0.5 cm nodule on image 49 and likely related to remote infectious or inflammatory process. No consolidative process or pneumothorax. Upper Abdomen: Negative. Musculoskeletal: No fracture or focal lesion. IMPRESSION: Extensive bullous or cystic change with a lower lobe predominance is not typical of emphysema associated with smoking and can be seen in alpha 1 antitrypsin deficiency and ingestion of certain drugs such as Ritalin. Mild mediastinal lymphadenopathy is nonspecific but likely reactive. Electronically Signed   By: Drusilla Kanner M.D.   On: 12/20/2016 17:27    Echo: - Left ventricle: The cavity size was normal. Wall thickness was   increased in a pattern of mild LVH. Systolic function was   vigorous. The estimated ejection fraction was in the range of 65%   to 70%. Wall motion was normal; there were no regional wall   motion abnormalities. Left ventricular diastolic function   parameters were normal. - Aortic valve: Peak velocity (S): 229 cm/s. Elevated outflow tract   gradient likely secondary to hyperdynamic LV function. Mean   gradient (S): 11 mm Hg. - Systemic veins: IVC is dilated with normal respiratory variation.   Estimated CVP 8 mmHg.    Subjective: Still feels short of breath on exertion, but overall he is feeling better. Wants to go home  Discharge Exam: Vitals:   12/24/16 0825 12/24/16 1248  BP:    Pulse:    Resp:    Temp:    SpO2: 96% 95%   Vitals:   12/24/16 0519 12/24/16 0805 12/24/16 0825 12/24/16 1248  BP: (!) 123/51     Pulse: 92     Resp: 18     Temp: 97.9 F (36.6 C)     TempSrc: Oral     SpO2: 96% 94% 96% 95%  Weight:       Height:        General: Pt is alert, awake, not in acute distress Cardiovascular: RRR, S1/S2 +, no rubs, no gallops Respiratory: Diminished breath sounds with coarse breath sounds at bases. No wheezing, Abdominal: Soft, NT, ND, bowel sounds + Extremities: no edema, no cyanosis    The results of significant diagnostics from this hospitalization (including imaging, microbiology, ancillary and laboratory) are listed below for reference.     Microbiology: No results found for this  or any previous visit (from the past 240 hour(s)).   Labs: BNP (last 3 results)  Recent Labs  12/20/16 0945  BNP 361.0*   Basic Metabolic Panel:  Recent Labs Lab 12/20/16 0945 12/23/16 0655  NA 134* 137  K 3.7 5.0  CL 95* 94*  CO2 32 36*  GLUCOSE 122* 144*  BUN 18 34*  CREATININE 1.11 1.11  CALCIUM 8.5* 8.3*   Liver Function Tests: No results for input(s): AST, ALT, ALKPHOS, BILITOT, PROT, ALBUMIN in the last 168 hours. No results for input(s): LIPASE, AMYLASE in the last 168 hours. No results for input(s): AMMONIA in the last 168 hours. CBC:  Recent Labs Lab 12/20/16 0945  WBC 14.4*  NEUTROABS 10.6*  HGB 16.8  HCT 52.1*  MCV 98.3  PLT 194   Cardiac Enzymes:  Recent Labs Lab 12/20/16 0945 12/20/16 1630 12/20/16 1849 12/20/16 2218  TROPONINI 0.17* 0.12* 0.12* 0.11*   BNP: Invalid input(s): POCBNP CBG:  Recent Labs Lab 12/22/16 0800  GLUCAP 134*   D-Dimer No results for input(s): DDIMER in the last 72 hours. Hgb A1c No results for input(s): HGBA1C in the last 72 hours. Lipid Profile No results for input(s): CHOL, HDL, LDLCALC, TRIG, CHOLHDL, LDLDIRECT in the last 72 hours. Thyroid function studies No results for input(s): TSH, T4TOTAL, T3FREE, THYROIDAB in the last 72 hours.  Invalid input(s): FREET3 Anemia work up No results for input(s): VITAMINB12, FOLATE, FERRITIN, TIBC, IRON, RETICCTPCT in the last 72 hours. Urinalysis No results found for:  COLORURINE, APPEARANCEUR, LABSPEC, PHURINE, GLUCOSEU, HGBUR, BILIRUBINUR, KETONESUR, PROTEINUR, UROBILINOGEN, NITRITE, LEUKOCYTESUR Sepsis Labs Invalid input(s): PROCALCITONIN,  WBC,  LACTICIDVEN Microbiology No results found for this or any previous visit (from the past 240 hour(s)).   Time coordinating discharge: Over 30 minutes  SIGNED:   Erick Blinks, MD  [facility] 12/24/2016, 8:03 PM Pager   If 7PM-7AM, please contact night-coverage [email or web address] Password [email or web address]

## 2016-12-24 NOTE — Progress Notes (Signed)
Patient discharged, IV removed - WNL. Reviewed DC instructions and medications, emphasized importance of completing doses of steroids and antibiotics and taking all meds as prescribed to prevent further exacerbation.  Verbalizes understanding.  No questions at this time. Assisted off unit in NAD.

## 2016-12-26 LAB — ALPHA-1 ANTITRYPSIN PHENOTYPE: A-1 Antitrypsin, Ser: 135 mg/dL (ref 90–200)

## 2017-01-04 ENCOUNTER — Emergency Department (HOSPITAL_COMMUNITY): Payer: Self-pay

## 2017-01-04 ENCOUNTER — Emergency Department (HOSPITAL_COMMUNITY)
Admission: EM | Admit: 2017-01-04 | Discharge: 2017-01-04 | Disposition: A | Discharge status: Home / Self Care | Payer: Self-pay | Attending: Emergency Medicine | Admitting: Emergency Medicine

## 2017-01-04 ENCOUNTER — Encounter (HOSPITAL_COMMUNITY): Payer: Self-pay | Admitting: Emergency Medicine

## 2017-01-04 DIAGNOSIS — Z5321 Procedure and treatment not carried out due to patient leaving prior to being seen by health care provider: Secondary | ICD-10-CM | POA: Insufficient documentation

## 2017-01-04 DIAGNOSIS — R0602 Shortness of breath: Secondary | ICD-10-CM | POA: Insufficient documentation

## 2017-01-04 DIAGNOSIS — I5030 Unspecified diastolic (congestive) heart failure: Secondary | ICD-10-CM | POA: Insufficient documentation

## 2017-01-04 DIAGNOSIS — J45909 Unspecified asthma, uncomplicated: Secondary | ICD-10-CM | POA: Insufficient documentation

## 2017-01-04 DIAGNOSIS — Z87891 Personal history of nicotine dependence: Secondary | ICD-10-CM | POA: Insufficient documentation

## 2017-01-04 DIAGNOSIS — J449 Chronic obstructive pulmonary disease, unspecified: Secondary | ICD-10-CM | POA: Insufficient documentation

## 2017-01-04 DIAGNOSIS — Z79899 Other long term (current) drug therapy: Secondary | ICD-10-CM | POA: Insufficient documentation

## 2017-01-04 DIAGNOSIS — I110 Hypertensive heart disease with heart failure: Secondary | ICD-10-CM | POA: Insufficient documentation

## 2017-01-04 HISTORY — DX: Unspecified diastolic (congestive) heart failure: I50.30

## 2017-01-04 NOTE — ED Notes (Signed)
Pt left AMA.  Refused to sign e-sig.  Stated " im not signing shit because I wasn't treated"  Pt refused to be wheeled out of room.  MD made aware.

## 2017-01-04 NOTE — ED Triage Notes (Addendum)
Pt refusing EKG,chest xray, and blood work. Pt reports " I was seen for the same thing last week, nothing has changed." pt family reports increased BLE. Pt reports intermittent cough with "browm" phlegm. Pt reports is supposed to wear 3 liters n/c all the time. Pt reports increased lethargy. Oxygen applied. Pt room air saturation 88%.

## 2017-01-04 NOTE — ED Provider Notes (Signed)
[facility]-EMERGENCY DEPT Provider Note   CSN: [id number] Arrival date & time: 01/04/17  1315     History   Chief Complaint Chief Complaint  Patient presents with  . Shortness of Breath    HPI Leslie Green is a 56 y.o. male.  HPI  Pt was seen at 1430.   Per pt, c/o gradual onset and worsening of persistent cough, wheezing and SOB for the past week. Has been associated with generalized fatigue and cough productive of "brown" sputum.   Describes his symptoms as "my asthma."  Has been using home O2, MDI and nebs without relief.  Denies CP/palpitations, no back pain, no abd pain, no N/V/D, no fevers, no rash.    Past Medical History:  Diagnosis Date  . Allergic rhinitis   . Asthma   . Diastolic CHF (HCC) 12/20/2016  . Essential hypertension   . Recurrent pneumonia     Patient Active Problem List   Diagnosis Date Noted  . Oral thrush 12/21/2016  . Acute respiratory failure (HCC) 12/20/2016  . COPD (chronic obstructive pulmonary disease) (HCC) 12/20/2016  . Demand ischemia (HCC) 12/20/2016    Past Surgical History:  Procedure Laterality Date  . None         Home Medications    Prior to Admission medications   Medication Sig Start Date End Date Taking? Authorizing Provider  albuterol (PROVENTIL HFA;VENTOLIN HFA) 108 (90 Base) MCG/ACT inhaler Inhale 1 puff into the lungs every 4 (four) hours as needed. 12/24/16   Erick BlinksMemon, Jehanzeb, MD  albuterol (PROVENTIL) (2.5 MG/3ML) 0.083% nebulizer solution Inhale 3 mLs (2.5 mg total) into the lungs 3 (three) times daily. 12/24/16   Erick BlinksMemon, Jehanzeb, MD  doxycycline (VIBRA-TABS) 100 MG tablet Take 1 tablet (100 mg total) by mouth every 12 (twelve) hours. 12/24/16   Erick BlinksMemon, Jehanzeb, MD  fluconazole (DIFLUCAN) 100 MG tablet Take 1 tablet (100 mg total) by mouth daily. 12/25/16   Erick BlinksMemon, Jehanzeb, MD  fluticasone furoate-vilanterol (BREO ELLIPTA) 200-25 MCG/INH AEPB Inhale 1 puff into the lungs daily.    [provider]  furosemide (LASIX)  40 MG tablet Take 1 tablet (40 mg total) by mouth daily. 12/24/16 12/24/17  Erick BlinksMemon, Jehanzeb, MD  guaiFENesin (MUCINEX) 600 MG 12 hr tablet Take 1 tablet (600 mg total) by mouth 2 (two) times daily. 12/24/16   Erick BlinksMemon, Jehanzeb, MD  ipratropium (ATROVENT) 0.02 % nebulizer solution Inhale 2.5 mLs (0.5 mg total) into the lungs 4 (four) times daily. 12/24/16   Erick BlinksMemon, Jehanzeb, MD  lovastatin (MEVACOR) 20 MG tablet Take 1 tablet by mouth every evening.    [provider]  mometasone (NASONEX) 50 MCG/ACT nasal spray Place 2 sprays into the nose daily.    [provider]  potassium chloride 20 MEQ TBCR Take 20 mEq by mouth daily. 12/24/16   Erick BlinksMemon, Jehanzeb, MD  predniSONE (DELTASONE) 10 MG tablet Take 40mg  po daily for 2 days then 30mg  daily for 2 days then 20mg  daily for 2 days then 10mg  daily for 2 days then stop 12/24/16   Erick BlinksMemon, Jehanzeb, MD  umeclidinium bromide (INCRUSE ELLIPTA) 62.5 MCG/INH AEPB Inhale 1 puff into the lungs daily.    [provider]    Family History Family History  Problem Relation Age of Onset  . Hypertension Mother   . Asthma Mother   . Colon cancer Father   . Lung cancer Father   . Diabetes Mellitus II Father   . Hypertension Father   . Hyperlipidemia Father   . CAD  Maternal Grandmother   . CAD Maternal Uncle     Social History Social History  Substance Use Topics  . Smoking status: Former Games developer  . Smokeless tobacco: Never Used  . Alcohol use Yes     Comment: occasionally      Allergies   Chicken allergy; Other; and Theophyllines   Review of Systems Review of Systems ROS: Statement: All systems negative except as marked or noted in the HPI; Constitutional: Negative for fever and chills. +generalized fatigue.; ; Eyes: Negative for eye pain, redness and discharge. ; ; ENMT: Negative for ear pain, hoarseness, nasal congestion, sinus pressure and sore throat. ; ; Cardiovascular: Negative for chest pain, palpitations, diaphoresis, and  peripheral edema. ; ; Respiratory: +cough, wheezing, SOB. Negative for stridor. ; ; Gastrointestinal: Negative for nausea, vomiting, diarrhea, abdominal pain, blood in stool, hematemesis, jaundice and rectal bleeding. . ; ; Genitourinary: Negative for dysuria, flank pain and hematuria. ; ; Musculoskeletal: Negative for back pain and neck pain. Negative for swelling and trauma.; ; Skin: Negative for pruritus, rash, abrasions, blisters, bruising and skin lesion.; ; Neuro: Negative for headache, lightheadedness and neck stiffness. Negative for weakness, altered level of consciousness, altered mental status, extremity weakness, paresthesias, involuntary movement, seizure and syncope.       Physical Exam Updated Vital Signs BP (!) 134/55 (BP Location: Left Arm)   Pulse 97   Temp 97.9 F (36.6 C) (Oral)   Resp 18   Ht  (1.803 m)   Wt 102.5 kg (226 lb)   SpO2 92%   BMI 31.52 kg/m   Physical Exam 1435: Physical examination:  Nursing notes reviewed; Vital signs and O2 SAT reviewed;  Constitutional: Well developed, Well nourished, Well hydrated, In no acute distress; Head:  Normocephalic, atraumatic; Eyes: EOMI, PERRL, No scleral icterus; ENMT: Mouth and pharynx normal, Mucous membranes moist; Neck: Supple, Full range of motion, No lymphadenopathy; Cardiovascular: Tachycardic rate and rhythm, No gallop; Respiratory: Breath sounds diminished & equal bilaterally, scattered wheezes.  Speaking short sentences, Normal respiratory effort/excursion; Chest: Nontender, Movement normal; Abdomen: Soft, Nontender, Nondistended, Normal bowel sounds; Genitourinary: No CVA tenderness; Extremities: Pulses normal, No tenderness, +2 pedal edema bilat. No calf asymmetry.; Neuro: AA&Ox3, Major CN grossly intact.  Speech clear. No gross focal motor or sensory deficits in extremities.; Skin: Color normal, Warm, Dry.   ED Treatments / Results  Labs (all labs ordered are listed, but only abnormal results are  displayed)   EKG  EKG Interpretation None       Radiology   Procedures Procedures (including critical care time)  Medications Ordered in ED Medications - No data to display   Initial Impression / Assessment and Plan / ED Course  I have reviewed the triage vital signs and the nursing notes.  Pertinent labs & imaging results that were available during my care of the patient were reviewed by me and considered in my medical decision making (see chart for details).  MDM Reviewed: previous chart, nursing note and vitals Reviewed previous: labs, CT scan, x-ray and ultrasound    1455:  Pt refusing any and all testing that I offered, stating he "doesn't need all that" and "is just here just for a nebulizer treatment, a steroid and an antibiotic." ED RN and I tried to explain to pt and family rationale behind dx testing, esp since during his last admission his BNP and troponin ("cardiac tests") were elevated (demand ischemia, CHF) due to his SOB. Pt continues to refuse, stating he's "leaving AMA  then." Pt and family informed re: rationale behind dx testing, and that I recommend dx testing for further evaluation.  Pt refuses anything offered by the ED other than his own demands.  I encouraged pt to stay, continues to refuse.  Pt makes his own medical decisions.  Risks of AMA explained to pt, including, but not limited to:  stroke, heart attack, cardiac arrythmia ("irregular heart rate/beat"), "passing out," temporary and/or permanent disability, death.  Pt verb understanding and continue to refuse dx testing, understanding the consequences of his decision.  I encouraged pt to follow up with his PMD today and return to the ED immediately if symptoms worsen, he changes his mind, or for any other concerns.  Pt verb understanding, agreeable. Pt walked down the hallway with steady gait, and left the ED with his family.       Final Clinical Impressions(s) / ED Diagnoses   Final diagnoses:   Shortness of breath    New Prescriptions New Prescriptions   No medications on file     Samuel Jester, DO 01/08/17 1531

## 2017-01-29 DEATH — deceased

## 2018-11-18 IMAGING — DX DG CHEST 2V
2 series · 2 of 2 positions shown · non-contrast
Comparison: Chest x-ray 12/11/2001 .

CLINICAL DATA: Cough and congestion.

EXAM:
CHEST  2 VIEW

[chest pa]
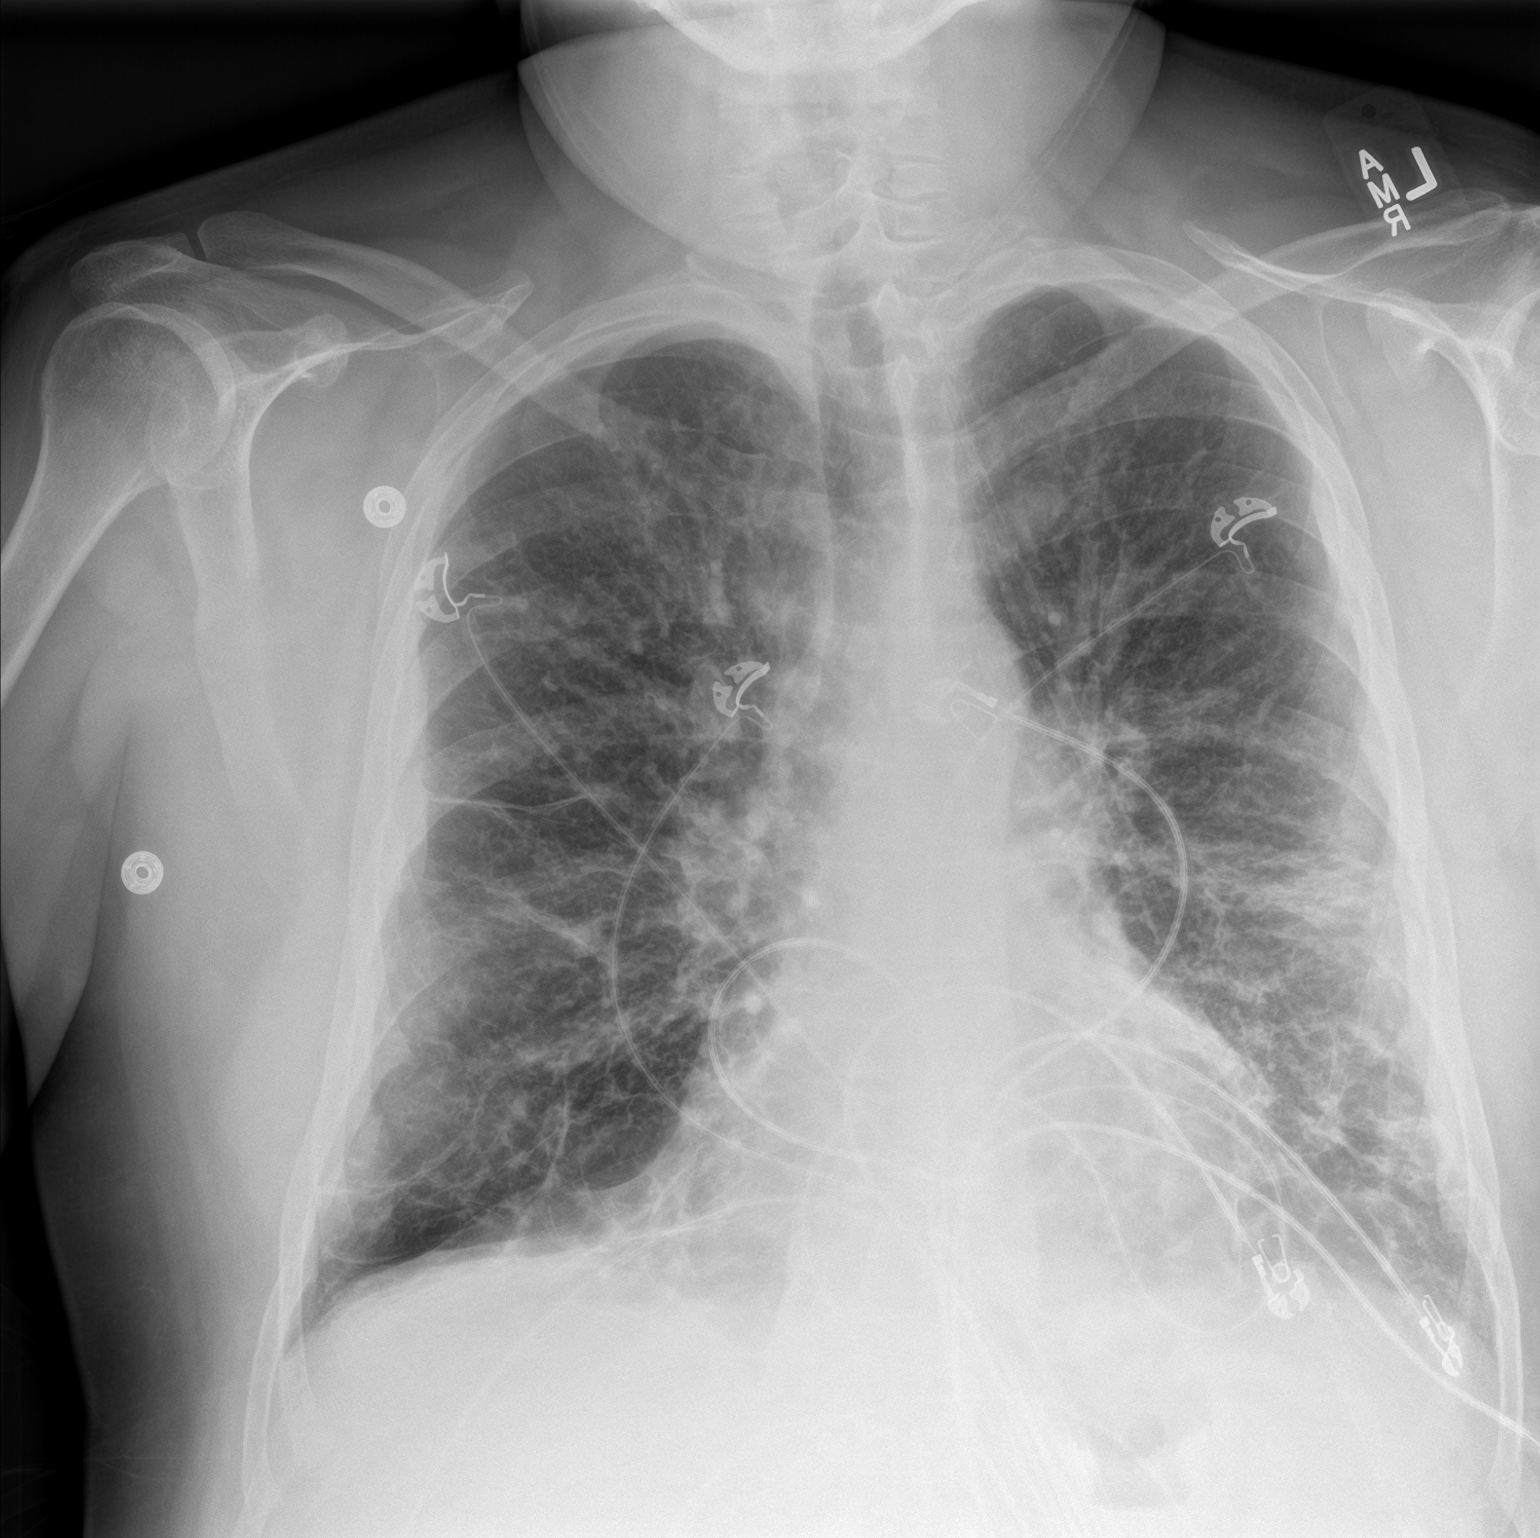

[chest lat]
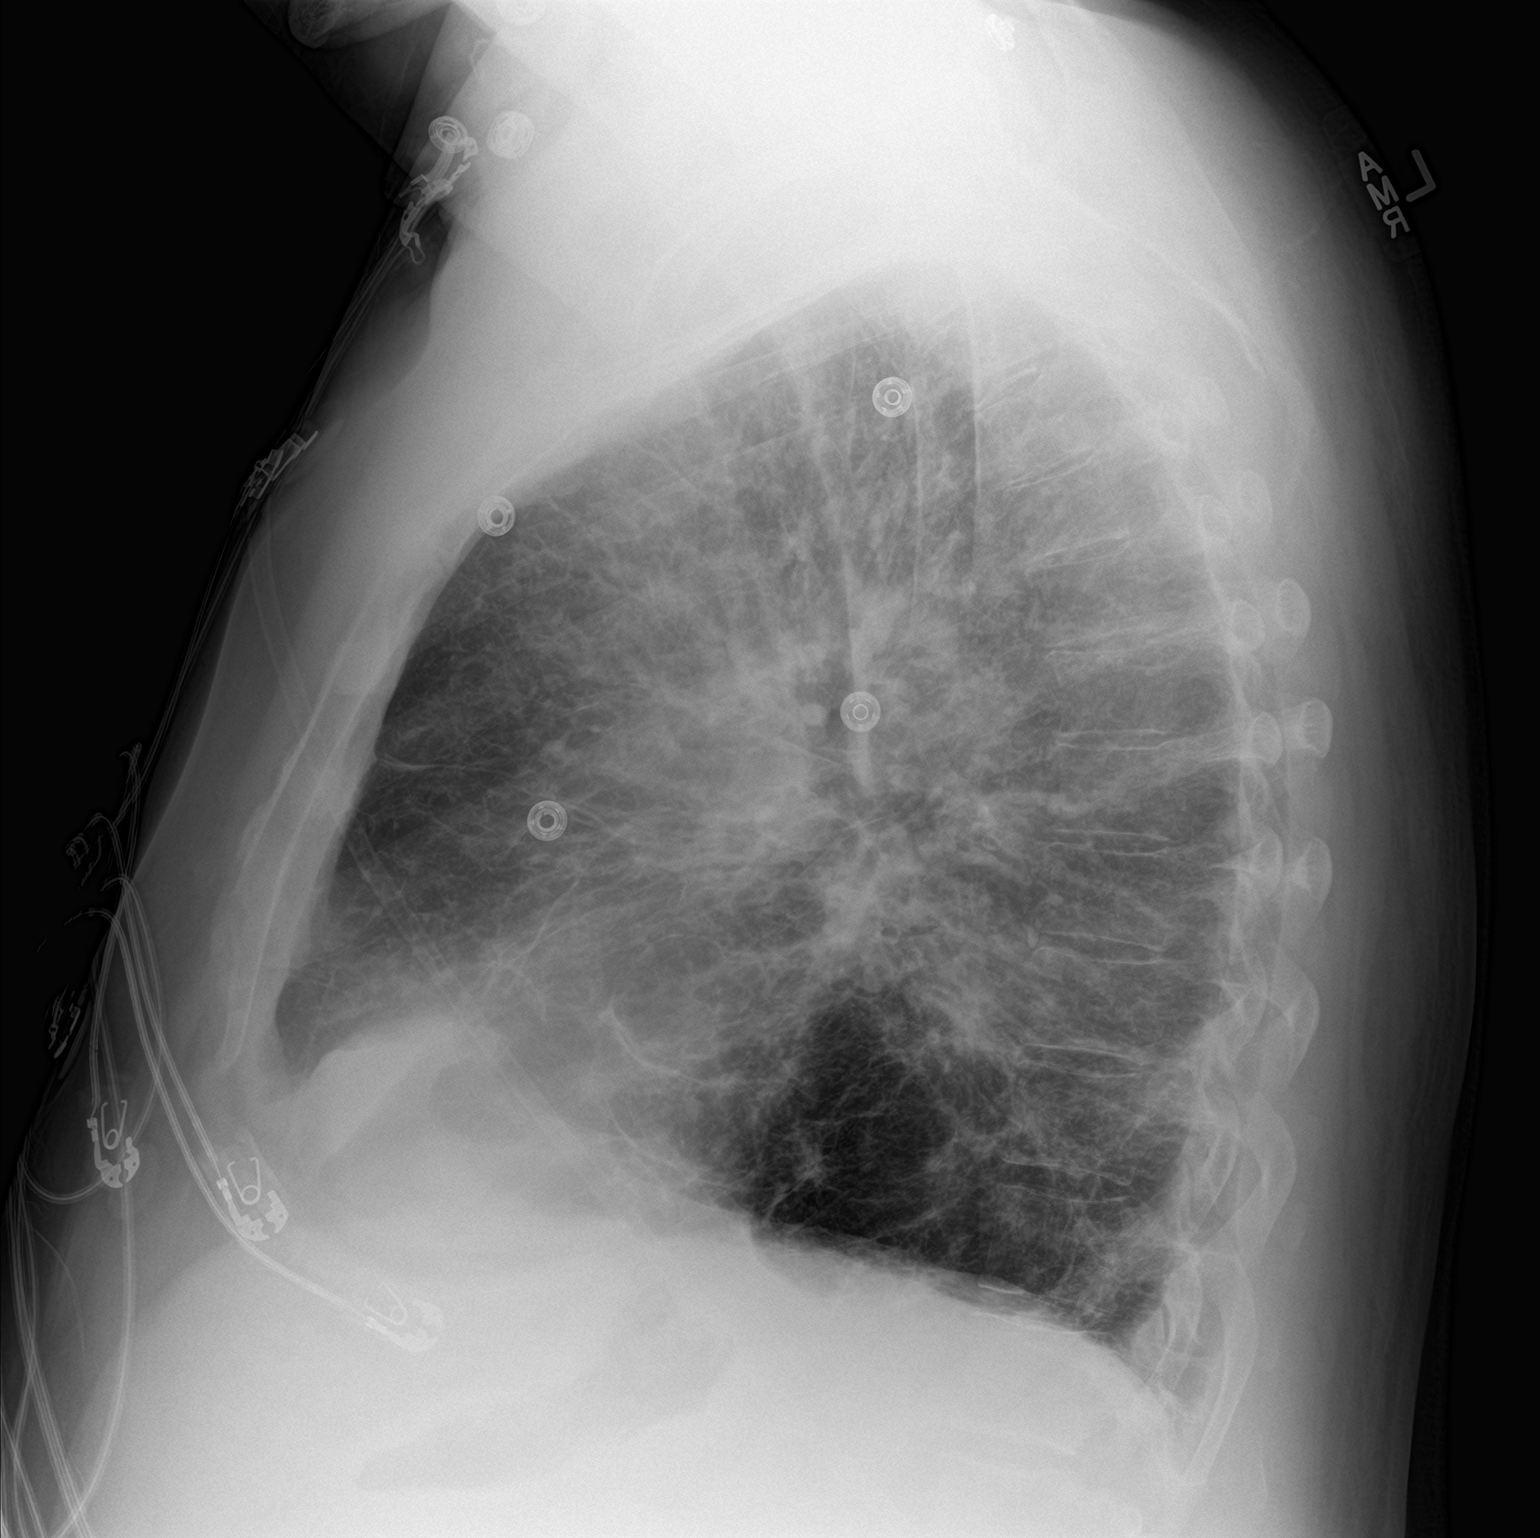

[2 of 2 positions shown; findings below may reference images not displayed]

FINDINGS: Mediastinum hilar structures are normal.

Cardiomegaly with mild pulmonary vascular prominence. Diffuse
bilateral pulmonary interstitial prominence noted. These changes may
be related to interstitial edema and/or pneumonitis. Underlying
chronic interstitial lung disease may be present. Chronic
interstitial lung disease was described on prior chest x-ray report
of 12/11/2001. No pneumothorax.
IMPRESSION: 1. Cardiomegaly with mild pulmonary venous congestion.

2. Diffuse bilateral pulmonary interstitial prominence noted. These
changes may be related to interstitial edema and/or pneumonitis.
Underlying chronic interstitial lung disease may be present. Chronic
interstitial lung disease described on prior chest x-ray report of
12/11/2001. Follow-up chest x-ray can be obtained for continued
evaluation .

## 2018-11-18 IMAGING — CT CT CHEST W/O CM
2 of 3 series · 15 of 36 positions shown, 18 images · non-contrast
Comparison: PA and lateral chest 12/20/2016.

CLINICAL DATA: Cough and wheezing for several weeks. Long-standing
history of asthma.

EXAM:
CT CHEST WITHOUT CONTRAST
TECHNIQUE: Multidetector CT imaging of the chest was performed following the
standard protocol without IV contrast.

[Series 2: thorax · axial · 0.71mm/px · z∈[-298,-14]mm · 12 of 168 slices shown, 15 images]
[im 13/168  mediastinal]
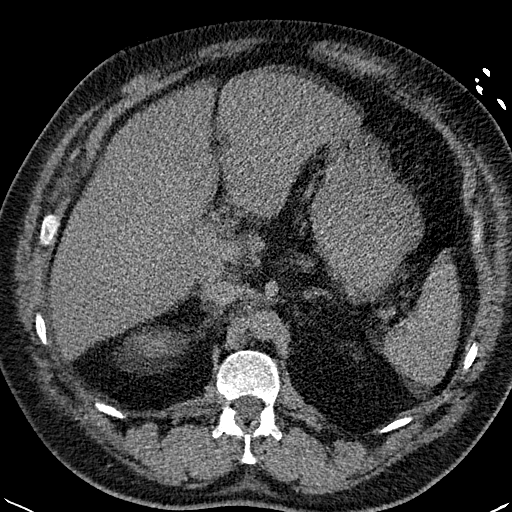
[im 13/168  lung]
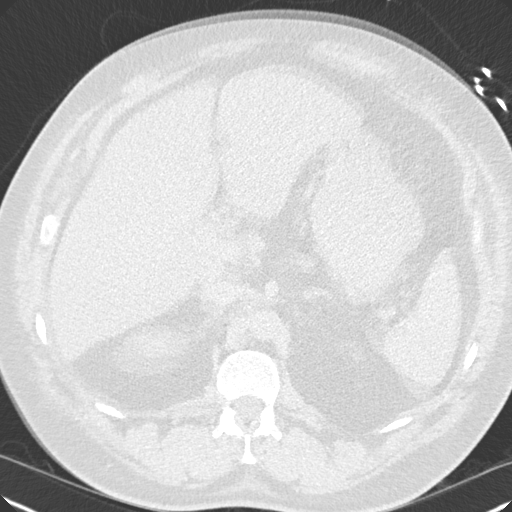
[im 25/168  lung]
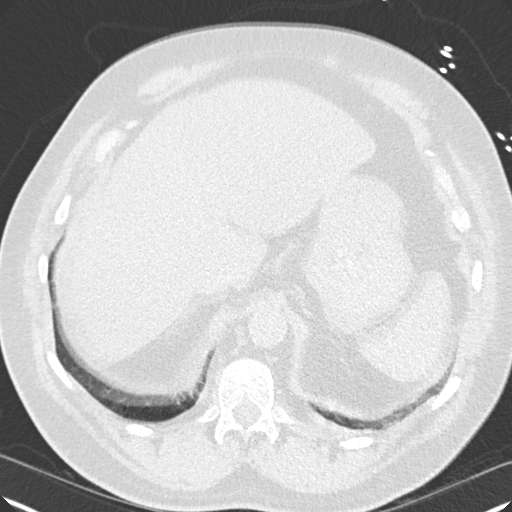
[im 38/168  lung]
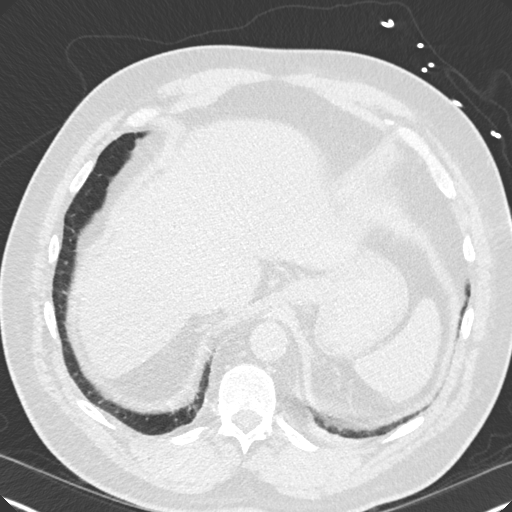
[im 50/168  lung]
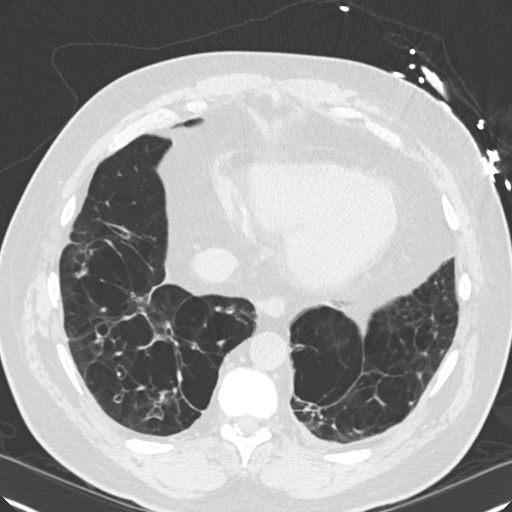
[im 62/168  mediastinal]
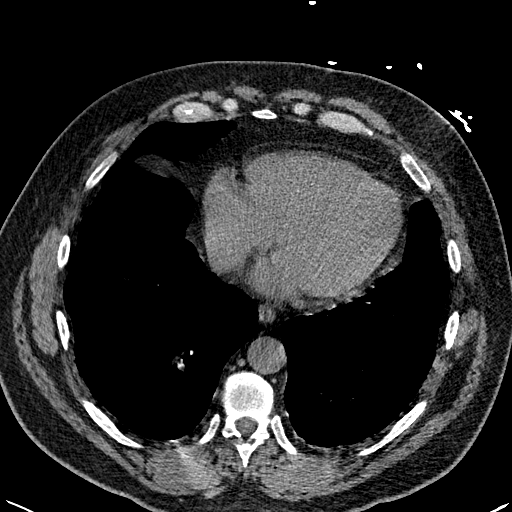
[im 62/168  lung]
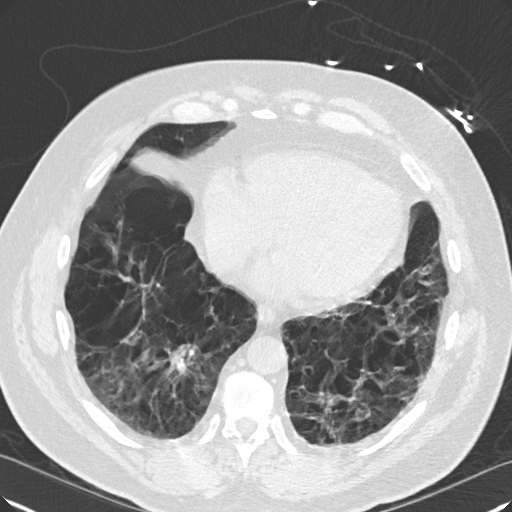
[im 75/168  lung]
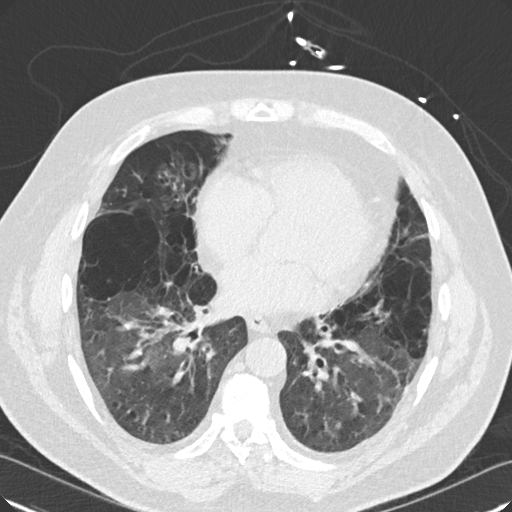
[im 93/168  lung]
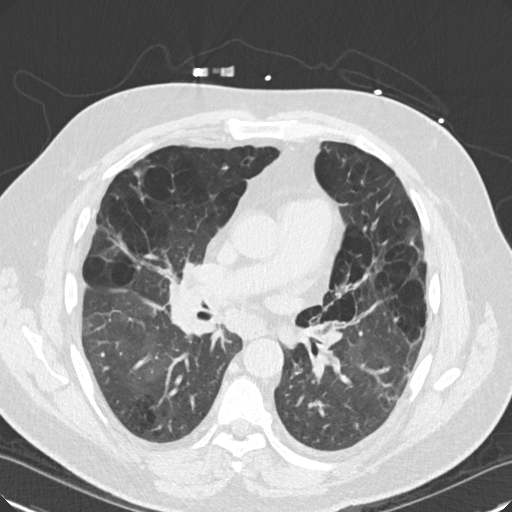
[im 106/168  lung]
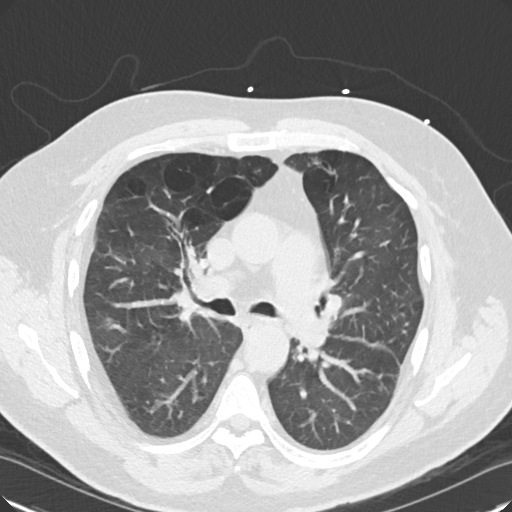
[im 118/168  mediastinal]
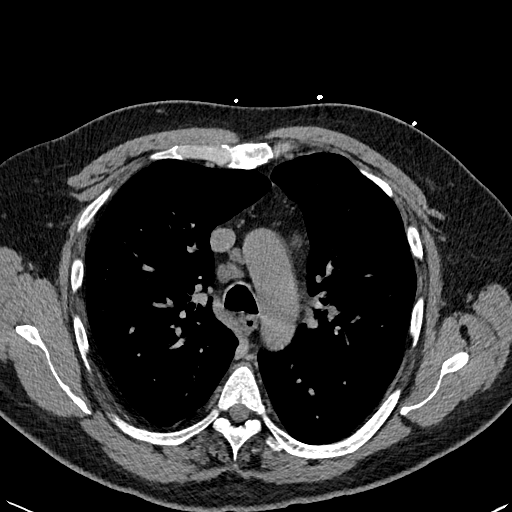
[im 118/168  lung]
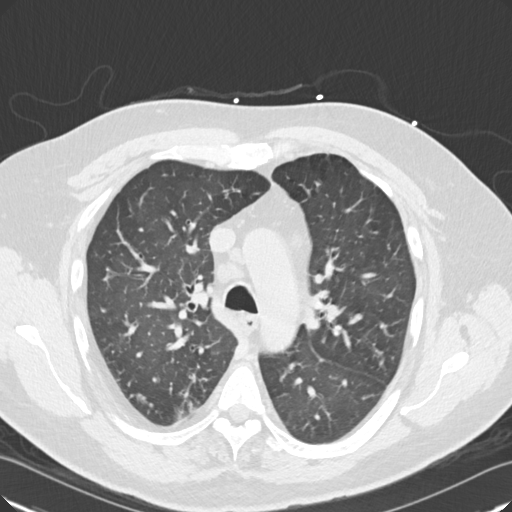
[im 130/168  lung]
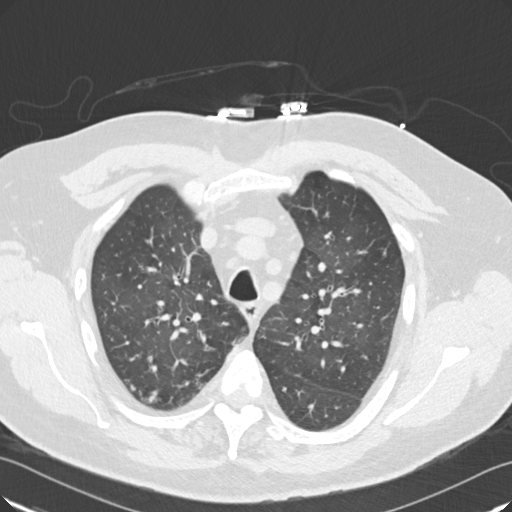
[im 143/168  lung]
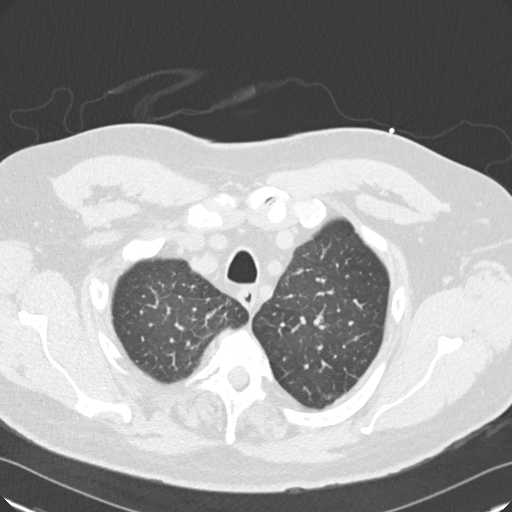
[im 155/168  lung]
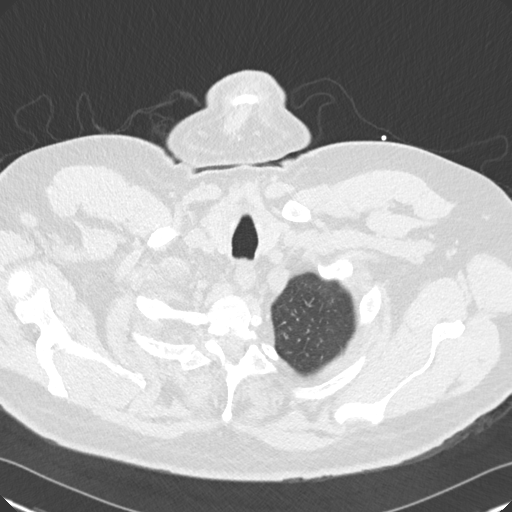

[Series 5: coronal · coronal · 0.69mm/px · 3 of 164 slices shown]
[im 33/164  lung]
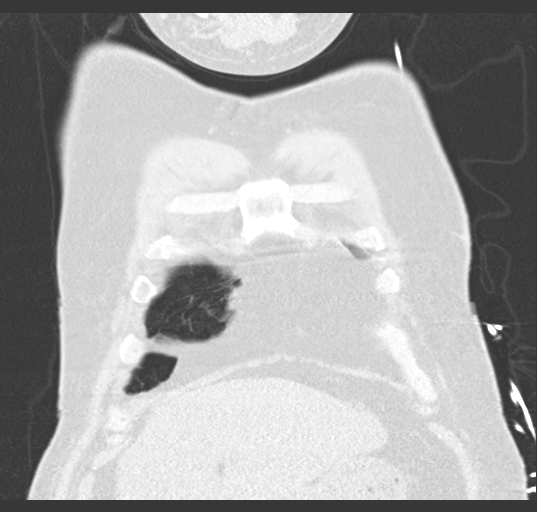
[im 66/164  lung]
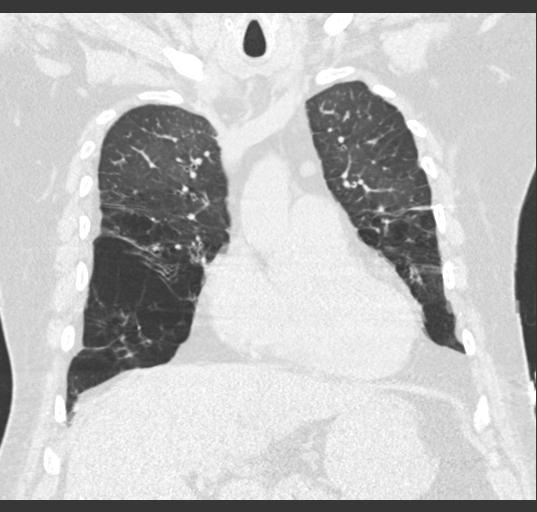
[im 98/164  lung]
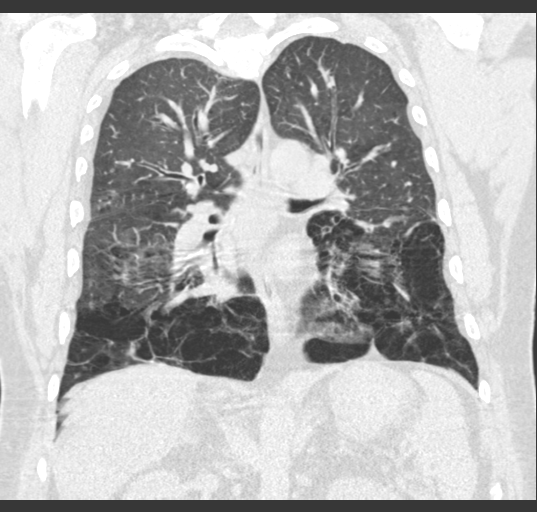

[15 of 36 positions shown; findings below may reference images not displayed]

FINDINGS: Cardiovascular: Heart size is normal. No pericardial effusion. No
calcific atherosclerosis.

Mediastinum/Nodes: Scattered small mediastinal lymph nodes are
identified including a pretracheal node on image 51 measuring 1.1 cm
and a prevascular node on image 54 measuring 1.2 cm short axis
dimension. No definite hilar adenopathy is identified.

Lungs/Pleura: No pleural effusion. Extensive bullous or cystic
change is identified with a predominance in the lower lobes. Milder
degree of similar change in the right middle lobe and lingula is
identified. The upper lobes are spared. A few scattered
subcentimeter pulmonary nodules such as a 0.5 cm nodule on image 49
and likely related to remote infectious or inflammatory process. No
consolidative process or pneumothorax.

Upper Abdomen: Negative.

Musculoskeletal: No fracture or focal lesion.
IMPRESSION: Extensive bullous or cystic change with a lower lobe predominance is
not typical of emphysema associated with smoking and can be seen in
alpha 1 antitrypsin deficiency and ingestion of certain drugs such
as Ritalin.

Mild mediastinal lymphadenopathy is nonspecific but likely reactive.

## 2018-11-21 IMAGING — DX DG CHEST 2V
2 series · 2 of 2 positions shown · non-contrast
Comparison: Chest radiograph and CT dated 12/20/2016

CLINICAL DATA: Shortness of breath for 4 days.  History of asthma.

EXAM:
CHEST  2 VIEW

[chest pa]
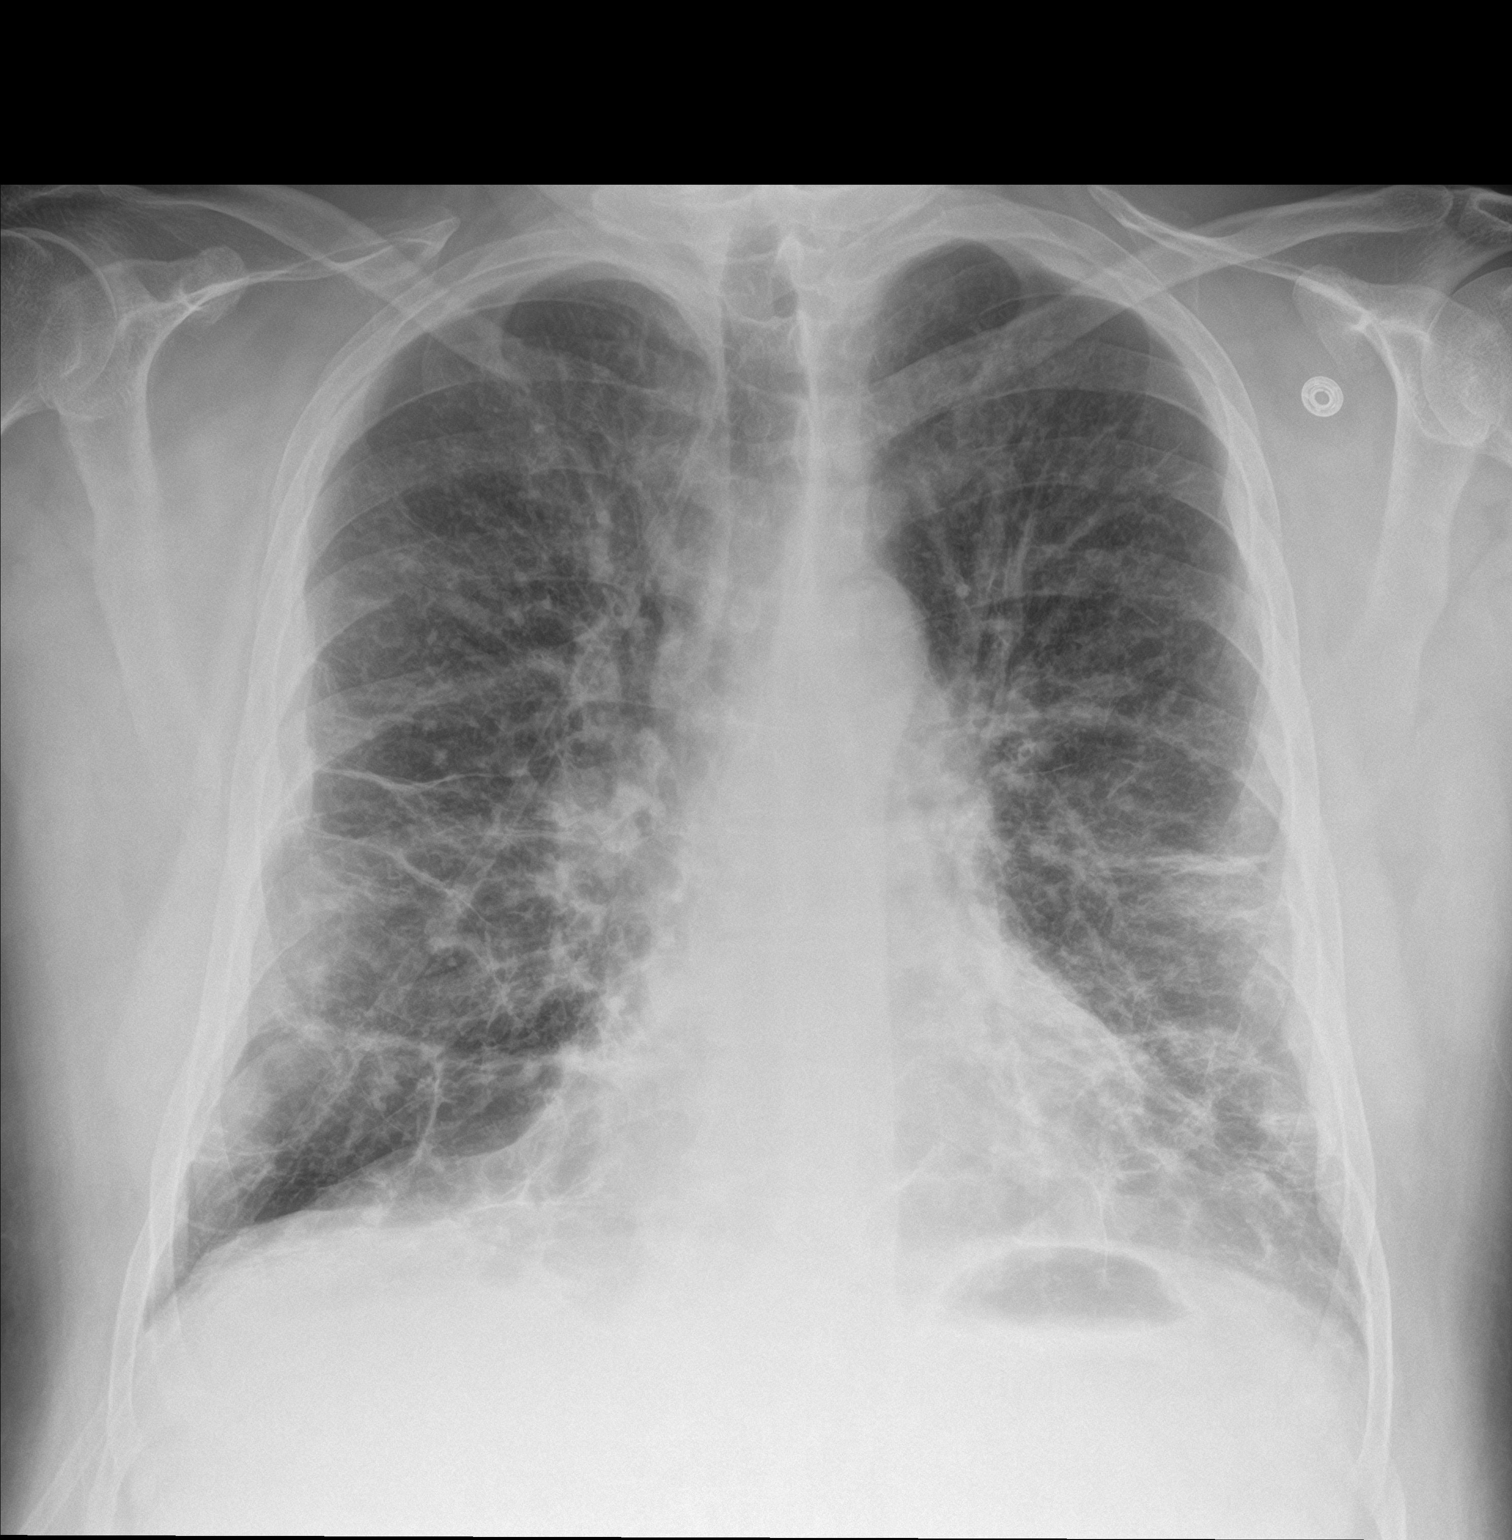

[chest lat]
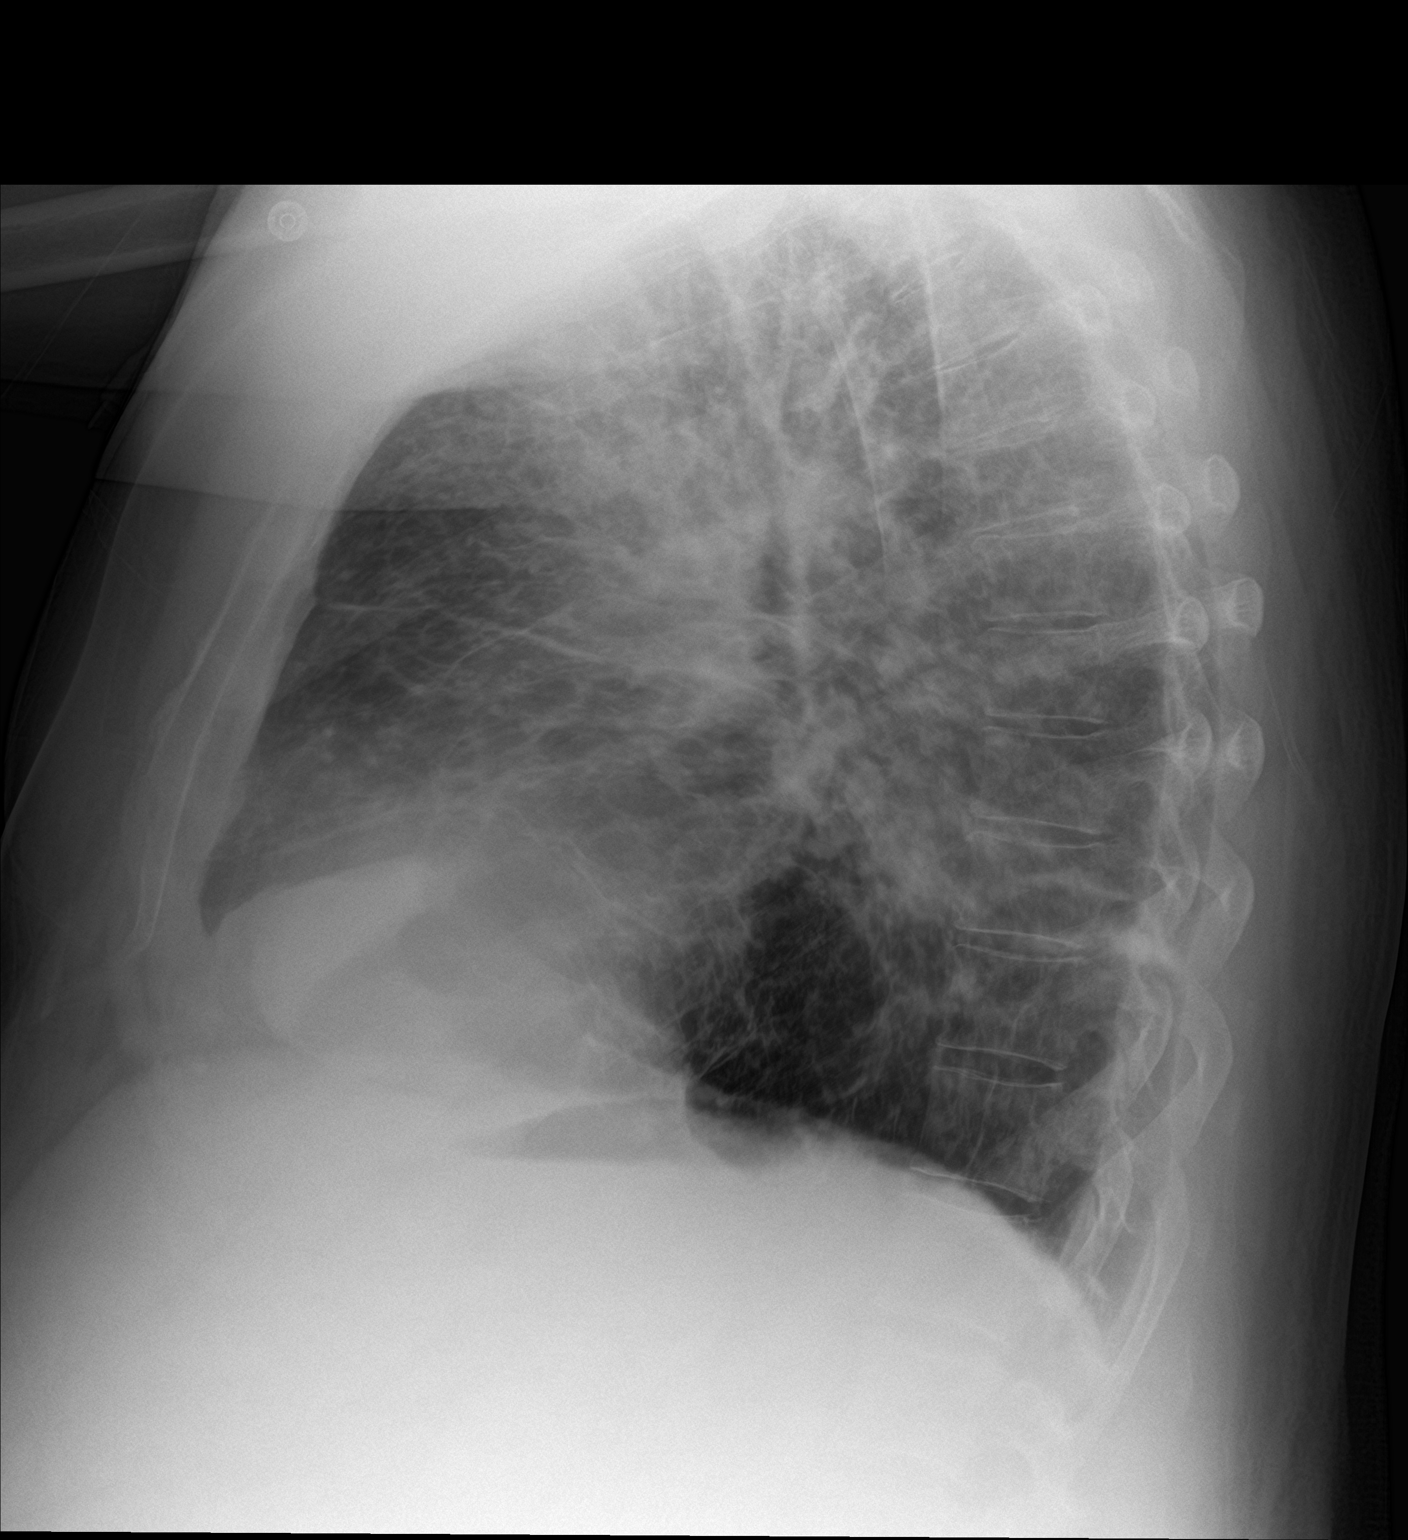

[2 of 2 positions shown; findings below may reference images not displayed]

FINDINGS: Prominent lung markings with increased lucency in the lower lung
fields. Findings compatible with chronic changes and similar to the
other recent imaging. There is no significant change from the recent
comparison examinations. No new areas of consolidation or airspace
disease. Heart size is within normal limits. The trachea is midline.
No large pleural effusions. No acute bone abnormality.
IMPRESSION: Chronic lung changes.  No acute findings.
